# Patient Record
Sex: Female | Born: 1985 | Race: Black or African American | Hispanic: No | Marital: Married | Smoking: Former smoker
Health system: Southern US, Community
[De-identification: ages and names within clinical notes are randomized; demographics above are authoritative.]

## PROBLEM LIST (undated history)

## (undated) DIAGNOSIS — F32A Depression, unspecified: Secondary | ICD-10-CM

## (undated) DIAGNOSIS — R87629 Unspecified abnormal cytological findings in specimens from vagina: Secondary | ICD-10-CM

## (undated) DIAGNOSIS — F419 Anxiety disorder, unspecified: Secondary | ICD-10-CM

## (undated) HISTORY — DX: Unspecified abnormal cytological findings in specimens from vagina: R87.629

---

## 2019-11-12 ENCOUNTER — Other Ambulatory Visit: Payer: Self-pay

## 2019-11-12 ENCOUNTER — Encounter: Payer: Self-pay | Admitting: Obstetrics & Gynecology

## 2019-11-12 ENCOUNTER — Ambulatory Visit (INDEPENDENT_AMBULATORY_CARE_PROVIDER_SITE_OTHER): Payer: Commercial Managed Care - PPO | Admitting: Obstetrics & Gynecology

## 2019-11-12 VITALS — BP 121/73 | HR 88 | Wt 257.0 lb

## 2019-11-12 DIAGNOSIS — Z0000 Encounter for general adult medical examination without abnormal findings: Secondary | ICD-10-CM

## 2019-11-12 NOTE — Progress Notes (Signed)
   GYNECOLOGY OFFICE VISIT NOTE  History:   Shannon Carlson is a 33 y.o. G1P0010 here today requesting annual exam.  Reports regular menstrual periods, cycle length vary by a couple of days but not concerning. Had a miscarriage, also wants to get pregnant. She denies any abnormal vaginal discharge, bleeding, pelvic pain or other concerns.    Past Medical History:  Diagnosis Date  . Vaginal Pap smear, abnormal     History reviewed. No pertinent surgical history.  The following portions of the patient's history were reviewed and updated as appropriate: allergies, current medications, past family history, past medical history, past social history, past surgical history and problem list.   Health Maintenance:  Normal pap many years ago.  Review of Systems:  Pertinent items noted in HPI and remainder of comprehensive ROS otherwise negative.  Physical Exam:  BP 121/73   Pulse 88   Wt 257 lb (116.6 kg)  CONSTITUTIONAL: Well-developed, well-nourished female in no acute distress.  Rest of physical exam deferred     Assessment and Plan:    1. Visit for preventive health examination Patient declined to have annual exam due to cost at this office She was referred to [facility] for breast exam and pap smear. No other gynecologic concerns. Routine preventative health maintenance measures emphasized. Please refer to After Visit Summary for other counseling recommendations.   Return for any gynecologic concerns.    Total face-to-face time with patient: 10 minutes.  Over 50% of encounter was spent on counseling and coordination of care.   Verita Schneiders, MD, [place] for [employer], [place]

## 2019-11-12 NOTE — Patient Instructions (Addendum)
Return to clinic for any scheduled appointments or for any gynecologic concerns as needed.   Preventive Care 32-32 Years Old, Female Preventive care refers to visits with your health care provider and lifestyle choices that can promote health and wellness. This includes:  A yearly physical exam. This may also be called an annual well check.  Regular dental visits and eye exams.  Immunizations.  Screening for certain conditions.  Healthy lifestyle choices, such as eating a healthy diet, getting regular exercise, not using drugs or products that contain nicotine and tobacco, and limiting alcohol use. What can I expect for my preventive care visit? Physical exam Your health care provider will check your:  Height and weight. This may be used to calculate body mass index (BMI), which tells if you are at a healthy weight.  Heart rate and blood pressure.  Skin for abnormal spots. Counseling Your health care provider may ask you questions about your:  Alcohol, tobacco, and drug use.  Emotional well-being.  Home and relationship well-being.  Sexual activity.  Eating habits.  Work and work Statistician.  Method of birth control.  Menstrual cycle.  Pregnancy history. What immunizations do I need?  Influenza (flu) vaccine  This is recommended every year. Tetanus, diphtheria, and pertussis (Tdap) vaccine  You may need a Td booster every 32 years. Varicella (chickenpox) vaccine  You may need this if you have not been vaccinated.32 Human papillomavirus (HPV) vaccine  If recommended by your health care provider, you may need three doses over 6 months. Measles, mumps, and rubella (MMR) vaccine  You may need at least one dose of MMR. You may also need a second dose. Meningococcal conjugate (MenACWY) vaccine  One dose is recommended if you are age 32-21 years and a first-year college student living in a residence hall, or if you have one of several medical conditions. You may  also need additional booster doses. Pneumococcal conjugate (PCV13) vaccine  You may need this if you have certain conditions and were not previously vaccinated.32 Pneumococcal polysaccharide (PPSV23) vaccine  You may need one or two doses if you smoke cigarettes or if you have certain conditions. Hepatitis A vaccine  You may need this if you have certain conditions or if you travel or work in places where you may be exposed to hepatitis A. Hepatitis B vaccine  You may need this if you have certain conditions or if you travel or work in places where you may be exposed to hepatitis B. Haemophilus influenzae type b (Hib) vaccine  You may need this if you have certain conditions. You may receive vaccines as individual doses or as more than one vaccine together in one shot (combination vaccines). Talk with your health care provider about the risks and benefits of combination vaccines. What tests do I need?  Blood tests  Lipid and cholesterol levels. These may be checked every 5 years starting at age 32.  Hepatitis C test.  Hepatitis B test. Screening  Diabetes screening. This is done by checking your blood sugar (glucose) after you have not eaten for a while (fasting).  Sexually transmitted disease (STD) testing.  BRCA-related cancer screening. This may be done if you have a family history of breast, ovarian, tubal, or peritoneal cancers.  Pelvic exam and Pap test. This may be done every 3 years starting at age 32. Starting at age 32, this may be done every 5 years if you have a Pap test in combination with an HPV test. Talk with your health  care provider about your test results, treatment options, and if necessary, the need for more tests. Follow these instructions at home: Eating and drinking   Eat a diet that includes fresh fruits and vegetables, whole grains, lean protein, and low-fat dairy.  Take vitamin and mineral supplements as recommended by your health care  provider.  Do not drink alcohol if: ? Your health care provider tells you not to drink. ? You are pregnant, may be pregnant, or are planning to become pregnant.  If you drink alcohol: ? Limit how much you have to 0-1 drink a day. ? Be aware of how much alcohol is in your drink. In the [place] one drink equals one 12 oz bottle of beer (355 mL), one 5 oz glass of wine (148 mL), or one 1 oz glass of hard liquor (44 mL). Lifestyle  Take daily care of your teeth and gums.  Stay active. Exercise for at least 30 minutes on 5 or more days each week.  Do not use any products that contain nicotine or tobacco, such as cigarettes, e-cigarettes, and chewing tobacco. If you need help quitting, ask your health care provider.  If you are sexually active, practice safe sex. Use a condom or other form of birth control (contraception) in order to prevent pregnancy and STIs (sexually transmitted infections). If you plan to become pregnant, see your health care provider for a preconception visit. What's next?  Visit your health care provider once a year for a well check visit.  Ask your health care provider how often you should have your eyes and teeth checked.  Stay up to date on all vaccines. This information is not intended to replace advice given to you by your health care provider. Make sure you discuss any questions you have with your health care provider. Document Released: 01/22/2002 Document Revised: 08/07/2018 Document Reviewed: 08/07/2018 Elsevier Patient Education  2020 [employer].

## 2019-12-23 ENCOUNTER — Ambulatory Visit: Payer: Commercial Managed Care - PPO | Admitting: Obstetrics and Gynecology

## 2020-01-12 ENCOUNTER — Ambulatory Visit (INDEPENDENT_AMBULATORY_CARE_PROVIDER_SITE_OTHER): Payer: Commercial Managed Care - PPO

## 2020-01-12 ENCOUNTER — Other Ambulatory Visit: Payer: Self-pay

## 2020-01-12 VITALS — BP 128/69 | HR 92 | Wt 262.1 lb

## 2020-01-12 DIAGNOSIS — Z23 Encounter for immunization: Secondary | ICD-10-CM | POA: Diagnosis not present

## 2020-01-12 DIAGNOSIS — Z124 Encounter for screening for malignant neoplasm of cervix: Secondary | ICD-10-CM

## 2020-01-12 DIAGNOSIS — Z1151 Encounter for screening for human papillomavirus (HPV): Secondary | ICD-10-CM

## 2020-01-12 DIAGNOSIS — Z01419 Encounter for gynecological examination (general) (routine) without abnormal findings: Secondary | ICD-10-CM

## 2020-01-12 DIAGNOSIS — F17200 Nicotine dependence, unspecified, uncomplicated: Secondary | ICD-10-CM | POA: Diagnosis not present

## 2020-01-12 NOTE — Patient Instructions (Addendum)
Primary care accepting new patients:   [facility] at [facility] [address] [place],  [place]  [address] [phone]  [facility] at [facility] [place], [place] [address] [place],  [place]  [address] [phone]  Coping with Quitting Smoking  Quitting smoking is a physical and mental challenge. You will face cravings, withdrawal symptoms, and temptation. Before quitting, work with your health care provider to make a plan that can help you cope. Preparation can help you quit and keep you from giving in. How can I cope with cravings? Cravings usually last for 5-10 minutes. If you get through it, the craving will pass. Consider taking the following actions to help you cope with cravings:  Keep your mouth busy: ? Chew sugar-free gum. ? Suck on hard candies or a straw. ? Brush your teeth.  Keep your hands and body busy: ? Immediately change to a different activity when you feel a craving. ? Squeeze or play with a ball. ? Do an activity or a hobby, like making bead jewelry, practicing needlepoint, or working with wood. ? Mix up your normal routine. ? Take a short exercise break. Go for a quick walk or run up and down stairs. ? Spend time in public places where smoking is not allowed.  Focus on doing something kind or helpful for someone else.  Call a friend or family member to talk during a craving.  Join a support group.  Call a quit line, such as 1-800-QUIT-NOW.  Talk with your health care provider about medicines that might help you cope with cravings and make quitting easier for you. How can I deal with withdrawal symptoms? Your body may experience negative effects as it tries to get used to not having nicotine in the system. These effects are called withdrawal symptoms. They may include:  Feeling hungrier than normal.  Trouble  concentrating.  Irritability.  Trouble sleeping.  Feeling depressed.  Restlessness and agitation.  Craving a cigarette. To manage withdrawal symptoms:  Avoid places, people, and activities that trigger your cravings.  Remember why you want to quit.  Get plenty of sleep.  Avoid coffee and other caffeinated drinks. These may worsen some of your symptoms. How can I handle social situations? Social situations can be difficult when you are quitting smoking, especially in the first few weeks. To manage this, you can:  Avoid parties, bars, and other social situations where people might be smoking.  Avoid alcohol.  Leave right away if you have the urge to smoke.  Explain to your family and friends that you are quitting smoking. Ask for understanding and support.  Plan activities with friends or family where smoking is not an option. What are some ways I can cope with stress? Wanting to smoke may cause stress, and stress can make you want to smoke. Find ways to manage your stress. Relaxation techniques can help. For example:  Breathe slowly and deeply, in through your nose and out through your mouth.  Listen to soothing, relaxing music.  Talk with a family member or friend about your stress.  Light a candle.  Soak in a bath or take a shower.  Think about a peaceful place. What are some ways I can prevent weight gain? Be aware that many people gain weight after they quit smoking. However, not everyone does. To keep from gaining weight, have a plan in place before you quit  and stick to the plan after you quit. Your plan should include:  Having healthy snacks. When you have a craving, it may help to: ? Eat plain popcorn, crunchy carrots, celery, or other cut vegetables. ? Chew sugar-free gum.  Changing how you eat: ? Eat small portion sizes at meals. ? Eat 4-6 small meals throughout the day instead of 1-2 large meals a day. ? Be mindful when you eat. Do not watch  television or do other things that might distract you as you eat.  Exercising regularly: ? Make time to exercise each day. If you do not have time for a long workout, do short bouts of exercise for 5-10 minutes several times a day. ? Do some form of strengthening exercise, like weight lifting, and some form of aerobic exercise, like running or swimming.  Drinking plenty of water or other low-calorie or no-calorie drinks. Drink 6-8 glasses of water daily, or as much as instructed by your health care provider. Summary  Quitting smoking is a physical and mental challenge. You will face cravings, withdrawal symptoms, and temptation to smoke again. Preparation can help you as you go through these challenges.  You can cope with cravings by keeping your mouth busy (such as by chewing gum), keeping your body and hands busy, and making calls to family, friends, or a helpline for people who want to quit smoking.  You can cope with withdrawal symptoms by avoiding places where people smoke, avoiding drinks with caffeine, and getting plenty of rest.  Ask your health care provider about the different ways to prevent weight gain, avoid stress, and handle social situations. This information is not intended to replace advice given to you by your health care provider. Make sure you discuss any questions you have with your health care provider. Document Revised: 11/08/2017 Document Reviewed: 11/23/2016 Elsevier Patient Education  2020 [employer].

## 2020-01-12 NOTE — Progress Notes (Signed)
GYNECOLOGY OFFICE VISIT NOTE-WELL WOMAN EXAM  History:   Shannon Carlson G1P0010 here today for annual exam.  She is currently not taking any birth control method and desires pregnancy.  She endorses usage of prenatal vitamins daily. She endorses pregnancy in the past with unknown GA in 2010/11.    She reports monthly menses lasting 3 days with moderate flow.  She reports some cramping that lasts about a day and "a few blood clots...that are small as a pea." She states she takes "BCs the green pack" for cramping. LMP was 12/17/2019 and was normal.   She reports having one partner in the last year. She reports sexual activity without pain or discomfort.  She denies any abnormal vaginal discharge, bleeding, or pelvic pain. She chooses to wait for STD testing due to costs.  Shannon Carlson denies issues with her breast and does not perform breast exams. She endorses breast awareness.  She denies a family history of breast, uterine, cervical, or ovarian cancer.  Shannon Carlson does not have a primary care doctor and does not exercise.  She reports smoking 5 cigarettes a day and desires to quit. However, she denies alcohol or drug use. She endorses balanced nutritional intake and states she gets lots of fruits and veggies.    Shannon Carlson reports that she is currently on furlough, but is employed as a Advertising copywriter at [employer]. She endorses safety at home and has good social support.  She reports some feelings of depression stating that "I have been feeling down after my fiance had a heart stent and did not want sexual contact."   She states that she feels a lot better now after talking with her fiance. She denies DV/A in the past 12 months.     Past Medical History:  Diagnosis Date  . Vaginal Pap smear, abnormal     No past surgical history on file.  The following portions of the patient's history were reviewed and updated as appropriate: allergies, current medications, past family history, past  medical history, past social history, past surgical history and problem list.   Health Maintenance:  Patient reports that her last pap was abnormal with colposcopy and biospy.  No mammogram d/t history.   Review of Systems:  Pertinent items noted in HPI and remainder of comprehensive ROS otherwise negative.    Objective:    Physical Exam BP 128/69   Pulse 92   Wt 262 lb 1.6 oz (118.9 kg)   LMP 12/17/2019 (Exact Date)  Physical Exam Exam conducted with a chaperone present.  Constitutional:      Appearance: Normal appearance.  HENT:     Head: Normocephalic and atraumatic.  Eyes:     Conjunctiva/sclera: Conjunctivae normal.  Cardiovascular:     Rate and Rhythm: Regular rhythm.     Heart sounds: Normal heart sounds.  Pulmonary:     Effort: Pulmonary effort is normal.     Breath sounds: Normal breath sounds.  Abdominal:     General: Bowel sounds are normal.  Genitourinary:    Labia:        Right: No tenderness or lesion.        Left: No tenderness or lesion.      Vagina: No vaginal discharge or bleeding.     Cervix: Cervical bleeding (After pap collected) present. No cervical motion tenderness, discharge, friability, lesion or erythema.     Comments: Pap collected with brush and spatula Uterine size and position difficult to assess d/t body  habitus, but no apparent enlargement.  Musculoskeletal:        General: Normal range of motion.     Cervical back: Normal range of motion.  Skin:    General: Skin is warm and dry.  Neurological:     Mental Status: She is alert.  Psychiatric:        Mood and Affect: Mood normal.        Behavior: Behavior normal.        Thought Content: Thought content normal.      Labs and Imaging No results found for this or any previous visit (from the past 168 hour(s)). No results found.   Assessment & Plan:       1. Well woman exam with routine gynecological exam -Exam findings discussed. -Educated on ASCCP guidelines regarding pap smear  evaluation and frequency. -Informed of turnover time and provider/clinic policy on releasing results. -Encouraged to activate Mychart and informed that all results will be released for patient review as they arrive. -Educated on AHA exercise recommendations of 30 minutes of moderate to vigorous activity at least 5x/week. -Educated and encouraged to initiate monthly SBE with increased breast awareness including examination of breast for skin changes, moles, tenderness, etc.  -Encouraged to continue PNV while trying to conceive. -Instructed to contact office if no conception after one year of actively trying.   - Cytology - PAP( [facility]) - Tdap vaccine greater than or equal to 7yo IM   2. Current every day smoker -Information given for Quitline. -Discussed how smoking cessation would be strongly advised prior to conception.    Routine preventative health maintenance measures emphasized. Please refer to After Visit Summary for other counseling recommendations.   No follow-ups on file.      Maryann Conners, CNM 01/12/2020

## 2020-01-14 LAB — CYTOLOGY - PAP
Comment: NEGATIVE
Diagnosis: NEGATIVE
High risk HPV: NEGATIVE

## 2020-01-15 ENCOUNTER — Telehealth: Payer: Self-pay

## 2020-01-15 DIAGNOSIS — A599 Trichomoniasis, unspecified: Secondary | ICD-10-CM

## 2020-01-15 MED ORDER — METRONIDAZOLE 500 MG PO TABS: 2000.0000 mg | ORAL_TABLET | Freq: Once | ORAL | 0 refills | Status: AC

## 2020-01-15 NOTE — Telephone Encounter (Signed)
Shannon Carlson  [birth year] [id number]  Patient called and verified her identity via birth date and last 4 of her SSN.  Patient agreeable to results via phone and was informed of positive trichomoniasis findings on her pap smear.  Patient informed that this is a sexually transmitted infection.  Patient appropriately upset and comfort given.  Patient informed that script would be sent to pharmacy on file and recommendation for partner treatment made. Patient confirms pharmacy and instructed to take medication at least one hour after eating.  Patient questions addressed and provider will give information regarding trichomoniasis via mychart for patient review.  Patient without further questions or concerns and encouraged to call office if any should arise.  Cherre Robins MSN, CNM Advanced Practice Provider, Center for [facility]   **This visit was completed, in its entirety, via telehealth communications.  I personally spent >/=4 minutes on the phone providing recommendations, education, and guidance.**

## 2020-03-29 NOTE — BH Specialist Note (Signed)
Integrated Behavioral Health  Visit  03/29/2020 Shannon Carlson [id number]  Number of Integrated Behavioral Health visits: 1 Session Start time: 1:17  Session End time: 2:15 Total time: 61  Referring Provider: Jaynie Collins, MD Type of Visit: In person Patient/Family location: [facility] Provider location: [facility] All persons participating in visit: Patient Shannon Carlson and [facility] Emmelina Mcloughlin    Confirmed patient's address: Yes  Confirmed patient's phone number: Yes  Any changes to demographics: No   Confirmed patient's insurance: Yes  Any changes to patient's insurance: No   Discussed confidentiality: Yes   I connected with Temika Sutphin  In person verified that I am speaking with the correct person using two identifiers.    Patient and/or legal guardian expressed understanding and consented to in person visit: Yes   PRESENTING CONCERNS: Patient and/or family reports the following symptoms/concerns: Pt states her primary concern today is noticing that she is having mood swings, crying, irritability, depression, anxiety, poor concentration, fatigue, sleep difficulty, in the week preceding menses every month for at least the past 6 menstrual cycles; family has noticed as well. Pt also experiencing life stress that may be contributing factors; is open to both starting medication and learning a coping strategy today. Duration of problem: At least 6 months (symptoms may have begun earlier); Severity of problem: moderate  STRENGTHS (Protective Factors/Coping Skills): Strong social support, self-awareness; open to treatment  GOALS ADDRESSED: Patient will: 1.  Reduce symptoms of: anxiety, depression and stress  2.  Increase knowledge and/or ability of: self-management skills  3.  Demonstrate ability to: Increase healthy adjustment to current life circumstances and Increase motivation to adhere to plan of care  INTERVENTIONS: Interventions utilized:  Mindfulness or  Management consultant and Psychoeducation and/or Health Education Standardized Assessments completed: Not needed today  ASSESSMENT: Patient currently experiencing Premenstrual dysphoric disorder  Patient may benefit from psychoeducation and brief therapeutic interventions regarding coping with symptoms of anxiety and depression related to PMDD and life stress .  PLAN: 1. Follow up with behavioral health clinician on : Two weeks 2. Behavioral recommendations:  -Begin taking Zoloft as prescribed -CALM relaxation breathing exercise twice daily (morning; evening) 3. Referral(s): Integrated [employer] (In Clinic)  I discussed the assessment and treatment plan with the patient and/or parent/guardian. They were provided an opportunity to ask questions and all were answered. They agreed with the plan and demonstrated an understanding of the instructions.   They were advised to call back or seek an in-person evaluation if the symptoms worsen or if the condition fails to improve as anticipated.  Valetta Close Anahis Furgeson

## 2020-03-30 ENCOUNTER — Other Ambulatory Visit: Payer: Self-pay | Admitting: Obstetrics and Gynecology

## 2020-03-30 ENCOUNTER — Ambulatory Visit (INDEPENDENT_AMBULATORY_CARE_PROVIDER_SITE_OTHER): Payer: Commercial Managed Care - PPO | Admitting: Clinical

## 2020-03-30 DIAGNOSIS — F3281 Premenstrual dysphoric disorder: Secondary | ICD-10-CM | POA: Diagnosis not present

## 2020-03-30 DIAGNOSIS — Z658 Other specified problems related to psychosocial circumstances: Secondary | ICD-10-CM

## 2020-03-30 MED ORDER — SERTRALINE HCL 50 MG PO TABS: 50.0000 mg | ORAL_TABLET | Freq: Every day | ORAL | 4 refills | Status: DC

## 2020-03-30 NOTE — Patient Instructions (Signed)

## 2020-04-13 ENCOUNTER — Ambulatory Visit (INDEPENDENT_AMBULATORY_CARE_PROVIDER_SITE_OTHER): Payer: Commercial Managed Care - PPO | Admitting: Clinical

## 2020-04-13 ENCOUNTER — Other Ambulatory Visit: Payer: Self-pay

## 2020-04-13 DIAGNOSIS — Z658 Other specified problems related to psychosocial circumstances: Secondary | ICD-10-CM

## 2020-04-13 DIAGNOSIS — F3281 Premenstrual dysphoric disorder: Secondary | ICD-10-CM

## 2020-04-13 NOTE — BH Specialist Note (Signed)
Integrated Behavioral Health via Telemedicine Video Visit  04/13/2020 Makaela Cando [id number]  Number of Integrated Behavioral Health visits: 2 Session Start time: 1:46  Session End time: 1:59 Total time: 13  Referring Provider: Jaynie Collins, MD Type of Visit: Video Patient/Family location: Home [facility] Provider location: Center for [facility]  All persons participating in visit: Patient Petronella Shuford and [facility] Amaira Safley    Confirmed patient's address: Yes  Confirmed patient's phone number: Yes  Any changes to demographics: No   Confirmed patient's insurance: Yes  Any changes to patient's insurance: No   Discussed confidentiality: at previous visit  I connected with Cyndia Bent  by a video enabled telemedicine application and verified that I am speaking with the correct person using two identifiers.     I discussed the limitations of evaluation and management by telemedicine and the availability of in person appointments.  I discussed that the purpose of this visit is to provide behavioral health care while limiting exposure to the novel coronavirus.   Discussed there is a possibility of technology failure and discussed alternative modes of communication if that failure occurs.  I discussed that engaging in this video visit, they consent to the provision of behavioral healthcare and the services will be billed under their insurance.  Patient and/or legal guardian expressed understanding and consented to video visit: Yes   PRESENTING CONCERNS: Patient and/or family reports the following symptoms/concerns: Pt states she is taking Zoloft as prescribed, felt a little tired after starting to take it, and no other side effects noticed. Pt says her family has noticed an improvement in her mood on a daily basis, shortly after beginning both Zoloft and daily self-coping strategies were implemented.  Duration of problem: At least 6 months,  possibly longer; Severity of problem: moderate  STRENGTHS (Protective Factors/Coping Skills): Strong social support, self-awareness and adhering to treatment  GOALS ADDRESSED: Patient will: 1.  Reduce symptoms of: anxiety, depression and stress   2.  Demonstrate ability to: Increase motivation to adhere to plan of care  INTERVENTIONS: Interventions utilized:  Supportive Counseling and Medication Monitoring Standardized Assessments completed: Will do PHQ9/GAD7 at next visit; will check symptoms after completion of menstrual cycle  ASSESSMENT: Patient currently experiencing Premenstrual dysphoric disorder.   Patient may benefit from continued psychoeducation and brief therapeutic interventions regarding coping with symptoms of anxiety, depression, and stress .  PLAN: 1. Follow up with behavioral health clinician on : Two weeks 2. Behavioral recommendations:  -Continue taking Zoloft as prescribed -Continue CALM relaxation breathing exercise twice daily  -Consider using apps as additional self-care within the next two weeks, if needed -Continue planning summer vacation (future thinking!) 3. Referral(s): Integrated [employer] [facility] (In Clinic)  I discussed the assessment and treatment plan with the patient and/or parent/guardian. They were provided an opportunity to ask questions and all were answered. They agreed with the plan and demonstrated an understanding of the instructions.   They were advised to call back or seek an in-person evaluation if the symptoms worsen or if the condition fails to improve as anticipated.  Valetta Close Tanija Germani

## 2020-04-21 NOTE — BH Specialist Note (Signed)
Integrated Behavioral Health via Telemedicine Video Visit  04/21/2020 Shannon Carlson [id number]  Number of [place] visits: 3 Session Start time: 2:53  Session End time: 2:57 Total time: 4  Referring Provider: Verita Schneiders, MD Type of Visit: Video Patient/Family location: Home [facility] Provider location: Center for [place] [facility]  All persons participating in visit: Patient Shannon Carlson and [place]    Confirmed patient's address: Yes  Confirmed patient's phone number: Yes  Any changes to demographics: No   Confirmed patient's insurance: Yes  Any changes to patient's insurance: No   Discussed confidentiality: at previous visit  I connected with Shannon Carlson  by a video enabled telemedicine application and verified that I am speaking with the correct person using two identifiers.     I discussed the limitations of evaluation and management by telemedicine and the availability of in person appointments.  I discussed that the purpose of this visit is to provide behavioral health care while limiting exposure to the novel coronavirus.   Discussed there is a possibility of technology failure and discussed alternative modes of communication if that failure occurs.  I discussed that engaging in this video visit, they consent to the provision of behavioral healthcare and the services will be billed under their insurance.  Patient and/or legal guardian expressed understanding and consented to video visit: Yes   PRESENTING CONCERNS: Patient and/or family reports the following symptoms/concerns: Pt states she has maintained mood stability on Zoloft through past menstrual cycle; agrees to call Shannon Carlson if symptoms increase again, prior to next cycle.  Duration of problem: Over 6 months; Severity of problem: mild  STRENGTHS (Protective Factors/Coping Skills): Strong social support, self-aware; adhering to treatment    GOALS ADDRESSED: Patient will: 1.  Reduce symptoms of: anxiety, depression and stress  2.  Demonstrate ability to: Increase healthy adjustment to current life circumstances  INTERVENTIONS: Interventions utilized:  Supportive Counseling Standardized Assessments completed: Not Needed  ASSESSMENT: Patient currently experiencing Premenstrual dysphoric disorder .   Patient may benefit from continued psychoeducation and brief therapeutic interventions regarding coping with symptoms of mood instability with PMDD .  PLAN: 1. Follow up with behavioral health clinician on : As needed 2. Behavioral recommendations:  -Continue taking Zoloft as prescribed -Continue using daily self-coping strategies that have been helpful in reducing mood instability (relaxation breathing, apps, making vacation plans, time outdoors, etc.) 3. Referral(s): [place] (In Clinic)  I discussed the assessment and treatment plan with the patient and/or parent/guardian. They were provided an opportunity to ask questions and all were answered. They agreed with the plan and demonstrated an understanding of the instructions.   They were advised to call back or seek an in-person evaluation if the symptoms worsen or if the condition fails to improve as anticipated.  Shannon Carlson Shannon Carlson  Depression screen [facility] 2/9 01/12/2020 11/12/2019  Decreased Interest 0 1  Down, Depressed, Hopeless 0 1  PHQ - 2 Score 0 2  Altered sleeping 0 0  Tired, decreased energy 0 1  Change in appetite 0 0  Feeling bad or failure about yourself  1 0  Trouble concentrating 0 0  Moving slowly or fidgety/restless 0 0  Suicidal thoughts 0 0  PHQ-9 Score 1 3   GAD 7 : Generalized Anxiety Score 01/12/2020 11/12/2019  Nervous, Anxious, on Edge 0 0  Control/stop worrying 1 1  Worry too much - different things 1 1  Trouble relaxing 1 1  Restless  0 0  Easily annoyed or irritable 1 1  Afraid - awful might happen 1 0  Total  GAD 7 Score 5 4

## 2020-04-27 ENCOUNTER — Other Ambulatory Visit: Payer: Self-pay

## 2020-04-27 ENCOUNTER — Ambulatory Visit (INDEPENDENT_AMBULATORY_CARE_PROVIDER_SITE_OTHER): Payer: Commercial Managed Care - PPO | Admitting: Clinical

## 2020-04-27 DIAGNOSIS — F3281 Premenstrual dysphoric disorder: Secondary | ICD-10-CM

## 2020-05-21 ENCOUNTER — Other Ambulatory Visit: Payer: Self-pay | Admitting: Obstetrics and Gynecology

## 2020-09-14 ENCOUNTER — Telehealth: Payer: Self-pay

## 2020-09-14 MED ORDER — SERTRALINE HCL 50 MG PO TABS: 50.0000 mg | ORAL_TABLET | Freq: Every day | ORAL | 4 refills | Status: DC

## 2020-09-14 NOTE — Telephone Encounter (Signed)
Received message from on call nurse concerning patient. Patient reported she is in need of medication. I attempted to call back but there was no answer.

## 2020-09-14 NOTE — Addendum Note (Signed)
Addended by: Catalina Antigua on: 09/14/2020 04:33 PM   Modules accepted: Orders

## 2020-09-14 NOTE — Telephone Encounter (Signed)
Patient is requesting a refill on zoloft. Are you ok with this? I am going to help her find PCP.

## 2021-04-14 ENCOUNTER — Encounter (HOSPITAL_COMMUNITY): Payer: Self-pay

## 2021-04-14 ENCOUNTER — Ambulatory Visit (HOSPITAL_COMMUNITY)
Admission: EM | Admit: 2021-04-14 | Discharge: 2021-04-14 | Disposition: A | Discharge status: Home / Self Care | Payer: Commercial Managed Care - PPO | Attending: Emergency Medicine | Admitting: Emergency Medicine

## 2021-04-14 ENCOUNTER — Other Ambulatory Visit: Payer: Self-pay

## 2021-04-14 ENCOUNTER — Ambulatory Visit (INDEPENDENT_AMBULATORY_CARE_PROVIDER_SITE_OTHER): Payer: Commercial Managed Care - PPO

## 2021-04-14 DIAGNOSIS — M25571 Pain in right ankle and joints of right foot: Secondary | ICD-10-CM | POA: Diagnosis not present

## 2021-04-14 DIAGNOSIS — W19XXXA Unspecified fall, initial encounter: Secondary | ICD-10-CM | POA: Diagnosis not present

## 2021-04-14 DIAGNOSIS — S93411A Sprain of calcaneofibular ligament of right ankle, initial encounter: Secondary | ICD-10-CM | POA: Diagnosis not present

## 2021-04-14 MED ORDER — IBUPROFEN 800 MG PO TABS: 800.0000 mg | ORAL_TABLET | Freq: Three times a day (TID) | ORAL | 0 refills | Status: DC

## 2021-04-14 NOTE — ED Triage Notes (Signed)
Pt in with c/o right ankle pain that occurred today when she fell over her dog  Denies numbness or tingling to extremity

## 2021-04-14 NOTE — ED Provider Notes (Signed)
[facility]    CSN: [id number] Arrival date & time: 04/14/21  7353      History   Chief Complaint Chief Complaint  Patient presents with  . Ankle Pain    HPI Shannon Carlson is a 35 y.o. female.   Shannon Carlson presents with complaints of right ankle pain after an injury yesterday. Her dog got under her feet causing her to trip and fall. She is uncertain if it twisted or if she struck it on the wall/chair/floor. Pain with flexion and dorsiflexion to posterior heel of foot. Has had mild sprains in the past but no previous foot or ankle surgeries or fractures. No redness or warmth. No numbness or tingling. Pain with weight bearing.     ROS per HPI, negative if not otherwise mentioned.      Past Medical History:  Diagnosis Date  . Vaginal Pap smear, abnormal     There are no problems to display for this patient.   History reviewed. No pertinent surgical history.  OB History    Gravida  1   Para      Term      Preterm      AB  1   Living        SAB  1   IAB      Ectopic      Multiple      Live Births               Home Medications    Prior to Admission medications   Medication Sig Start Date End Date Taking? Authorizing Provider  ibuprofen (ADVIL) 800 MG tablet Take 1 tablet (800 mg total) by mouth 3 (three) times daily. 04/14/21  Yes Georgetta Haber, NP  Prenatal Vit-Fe Fumarate-FA (PRENATAL PO) Take by mouth.    [provider]  sertraline (ZOLOFT) 50 MG tablet Take 1 tablet (50 mg total) by mouth daily. 09/14/20   Constant, Peggy, MD    Family History Family History  Problem Relation Age of Onset  . Asthma Mother   . Hypertension Father   . Hypertension Paternal Uncle   . Hypertension Paternal Grandmother   . Cancer Paternal Grandmother   . Hypertension Paternal Grandfather     Social History Social History   Tobacco Use  . Smoking status: Current Every Day Smoker    Packs/day: 1.00     Years: 14.00    Pack years: 14.00    Types: Cigarettes  . Smokeless tobacco: Never Used  Vaping Use  . Vaping Use: Never used  Substance Use Topics  . Alcohol use: Never  . Drug use: Yes    Types: Marijuana     Allergies   Penicillins   Review of Systems Review of Systems   Physical Exam Triage Vital Signs ED Triage Vitals  Enc Vitals Group     BP 04/14/21 1025 112/75     Pulse Rate 04/14/21 1025 82     Resp 04/14/21 1025 19     Temp 04/14/21 1025 98.4 F (36.9 C)     Temp src --      SpO2 04/14/21 1025 98 %     Weight --      Height --      Head Circumference --      Peak Flow --      Pain Score 04/14/21 1023 7     Pain Loc --      Pain Edu? --  Excl. in GC? --    No data found.  Updated Vital Signs BP 112/75   Pulse 82   Temp 98.4 F (36.9 C)   Resp 19   LMP 03/30/2021 (Approximate)   SpO2 98%   Visual Acuity Right Eye Distance:   Left Eye Distance:   Bilateral Distance:    Right Eye Near:   Left Eye Near:    Bilateral Near:     Physical Exam Constitutional:      General: She is not in acute distress.    Appearance: She is well-developed.  Cardiovascular:     Rate and Rhythm: Normal rate.  Pulmonary:     Effort: Pulmonary effort is normal.  Musculoskeletal:     Right ankle:     Right Achilles Tendon: Tenderness present. No defects. Thompson's test negative.     Right foot: Tenderness and bony tenderness present.     Comments: Tenderness about the posterior calcaneous with negative thompsons; no redness swelling bruising or warmth   Skin:    General: Skin is warm and dry.  Neurological:     Mental Status: She is alert and oriented to person, place, and time.      UC Treatments / Results  Labs (all labs ordered are listed, but only abnormal results are displayed) Labs Reviewed - No data to display  EKG   Radiology DG Ankle Complete Right  Result Date: 04/14/2021 CLINICAL DATA:  Trip and fall, pain, unable to bear  weight EXAM: RIGHT ANKLE - COMPLETE 3+ VIEW COMPARISON:  None. FINDINGS: There is no evidence of fracture, dislocation, or joint effusion. There is no evidence of arthropathy or other focal bone abnormality. Mild, diffuse soft tissue edema about the ankle. IMPRESSION: No fracture or dislocation of the right ankle. Mild, diffuse soft tissue edema. Electronically Signed   By: Lauralyn Primes M.D.   On: 04/14/2021 11:14    Procedures Procedures (including critical care time)  Medications Ordered in UC Medications - No data to display  Initial Impression / Assessment and Plan / UC Course  I have reviewed the triage vital signs and the nursing notes.  Pertinent labs & imaging results that were available during my care of the patient were reviewed by me and considered in my medical decision making (see chart for details).     Consistent with sprain; cam walker and crutches provided. Pain management and expected course of rehab discussed. Patient verbalized understanding and agreeable to plan.   Final Clinical Impressions(s) / UC Diagnoses   Final diagnoses:  Sprain of calcaneofibular ligament of right ankle, initial encounter     Discharge Instructions     Ice, elevation, ibuprofen every 8 hours as needed for pain.  Walking boot as needed for activity, wean off and out of this as able.  Follow up with sports medicine or orthopedics as needed if no improvement or worsening in the next 6 weeks.     ED Prescriptions    Medication Sig Dispense Auth. Provider   ibuprofen (ADVIL) 800 MG tablet Take 1 tablet (800 mg total) by mouth 3 (three) times daily. 30 tablet Georgetta Haber, NP     PDMP not reviewed this encounter.   Georgetta Haber, NP 04/14/21 1146

## 2021-04-14 NOTE — Discharge Instructions (Signed)
Ice, elevation, ibuprofen every 8 hours as needed for pain.  Walking boot as needed for activity, wean off and out of this as able.  Follow up with sports medicine or orthopedics as needed if no improvement or worsening in the next 6 weeks.

## 2021-10-18 ENCOUNTER — Ambulatory Visit (INDEPENDENT_AMBULATORY_CARE_PROVIDER_SITE_OTHER): Payer: Commercial Managed Care - PPO

## 2021-10-18 ENCOUNTER — Encounter (HOSPITAL_COMMUNITY): Payer: Self-pay | Admitting: Emergency Medicine

## 2021-10-18 ENCOUNTER — Other Ambulatory Visit: Payer: Self-pay

## 2021-10-18 ENCOUNTER — Ambulatory Visit (HOSPITAL_COMMUNITY)
Admission: EM | Admit: 2021-10-18 | Discharge: 2021-10-18 | Disposition: A | Discharge status: Home / Self Care | Payer: Commercial Managed Care - PPO | Attending: Medical Oncology | Admitting: Medical Oncology

## 2021-10-18 DIAGNOSIS — R062 Wheezing: Secondary | ICD-10-CM | POA: Diagnosis not present

## 2021-10-18 DIAGNOSIS — R051 Acute cough: Secondary | ICD-10-CM | POA: Diagnosis not present

## 2021-10-18 DIAGNOSIS — R059 Cough, unspecified: Secondary | ICD-10-CM

## 2021-10-18 DIAGNOSIS — R0989 Other specified symptoms and signs involving the circulatory and respiratory systems: Secondary | ICD-10-CM

## 2021-10-18 DIAGNOSIS — R0602 Shortness of breath: Secondary | ICD-10-CM | POA: Diagnosis not present

## 2021-10-18 MED ORDER — FLUTICASONE PROPIONATE 50 MCG/ACT NA SUSP: 2.0000 | Freq: Every day | NASAL | 0 refills | Status: DC

## 2021-10-18 MED ORDER — PREDNISONE 10 MG (21) PO TBPK: ORAL_TABLET | Freq: Every day | ORAL | 0 refills | Status: DC

## 2021-10-18 MED ORDER — ALBUTEROL SULFATE HFA 108 (90 BASE) MCG/ACT IN AERS: 1.0000 | INHALATION_SPRAY | Freq: Four times a day (QID) | RESPIRATORY_TRACT | 0 refills | Status: DC | PRN

## 2021-10-18 MED ORDER — BENZONATATE 100 MG PO CAPS: 100.0000 mg | ORAL_CAPSULE | Freq: Three times a day (TID) | ORAL | 0 refills | Status: DC

## 2021-10-18 NOTE — ED Triage Notes (Signed)
Nasal congestion, cough, headache. Tried robitussin, mucinex. Reports that cough is dry. Had home test 2 days ago that was negative. Work sent home and has to get a note cleared to return.

## 2021-10-18 NOTE — ED Provider Notes (Signed)
[facility]    CSN: [id number] Arrival date & time: 10/18/21  1533      History   Chief Complaint Chief Complaint  Patient presents with   Cough   Nasal Congestion    HPI Shannon Carlson is a 35 y.o. female.   HPI  Cough: Pt reports that she has had a dry cough for the past 7-10 days. Cough is dry in nature. Having coughing fits with wheezing which is something that she has never had. She reports no fever but does have some mild SOB and heaviness of chest when coughing. She has tried mucinex and robotussin but just feels that there is mucous in her lungs that she cannot bring up. No known sick contacts. No vomiting, diarrhea, hemoptysis.   Past Medical History:  Diagnosis Date   Vaginal Pap smear, abnormal     There are no problems to display for this patient.   History reviewed. No pertinent surgical history.  OB History     Gravida  1   Para      Term      Preterm      AB  1   Living         SAB  1   IAB      Ectopic      Multiple      Live Births               Home Medications    Prior to Admission medications   Medication Sig Start Date End Date Taking? Authorizing Provider  albuterol (VENTOLIN HFA) 108 (90 Base) MCG/ACT inhaler Inhale 1-2 puffs into the lungs every 6 (six) hours as needed for wheezing or shortness of breath. 10/18/21  Yes Krisalyn Yankowski M, PA-C  benzonatate (TESSALON) 100 MG capsule Take 1 capsule (100 mg total) by mouth every 8 (eight) hours. 10/18/21  Yes Bijan Ridgley M, PA-C  fluticasone [facility]) 50 MCG/ACT nasal spray Place 2 sprays into both nostrils daily. 10/18/21  Yes Holden Draughon M, PA-C  predniSONE (STERAPRED UNI-PAK 21 TAB) 10 MG (21) TBPK tablet Take by mouth daily. Start on 10/19/2021. Take 6 tabs by mouth daily  for 2 days, then 5 tabs for 2 days, then 4 tabs for 2 days, then 3 tabs for 2 days, 2 tabs for 2 days, then 1 tab by mouth daily for 2 days 10/18/21  Yes Jani Ploeger  M, PA-C  ibuprofen (ADVIL) 800 MG tablet Take 1 tablet (800 mg total) by mouth 3 (three) times daily. 04/14/21   Georgetta Haber, NP  Prenatal Vit-Fe Fumarate-FA (PRENATAL PO) Take by mouth.    [provider]  sertraline (ZOLOFT) 50 MG tablet Take 1 tablet (50 mg total) by mouth daily. 09/14/20   Constant, Peggy, MD    Family History Family History  Problem Relation Age of Onset   Asthma Mother    Hypertension Father    Hypertension Paternal Uncle    Hypertension Paternal Grandmother    Cancer Paternal Grandmother    Hypertension Paternal Grandfather     Social History Social History   Tobacco Use   Smoking status: Every Day    Packs/day: 1.00    Years: 14.00    Pack years: 14.00    Types: Cigarettes   Smokeless tobacco: Never  Vaping Use   Vaping Use: Never used  Substance Use Topics   Alcohol use: Never   Drug use: Yes    Types: Marijuana  Allergies   Penicillins   Review of Systems Review of Systems  As stated above in HPI Physical Exam Triage Vital Signs ED Triage Vitals  Enc Vitals Group     BP 10/18/21 1644 127/73     Pulse Rate 10/18/21 1644 86     Resp 10/18/21 1644 18     Temp 10/18/21 1644 98.8 F (37.1 C)     Temp Source 10/18/21 1644 Oral     SpO2 10/18/21 1644 97 %     Weight --      Height --      Head Circumference --      Peak Flow --      Pain Score 10/18/21 1643 6     Pain Loc --      Pain Edu? --      Excl. in GC? --    No data found.  Updated Vital Signs BP 127/73 (BP Location: Left Arm)   Pulse 86   Temp 98.8 F (37.1 C) (Oral)   Resp 18   LMP 10/10/2021   SpO2 97%   Physical Exam Vitals and nursing note reviewed.  Constitutional:      General: She is not in acute distress.    Appearance: Normal appearance. She is not ill-appearing, toxic-appearing or diaphoretic.  HENT:     Head: Normocephalic and atraumatic.     Right Ear: Tympanic membrane normal.     Left Ear: Tympanic membrane normal.     Nose:  Congestion present. No rhinorrhea.     Mouth/Throat:     Mouth: Mucous membranes are moist.     Pharynx: Oropharynx is clear. No oropharyngeal exudate or posterior oropharyngeal erythema.  Eyes:     Extraocular Movements: Extraocular movements intact.     Pupils: Pupils are equal, round, and reactive to light.  Cardiovascular:     Rate and Rhythm: Normal rate and regular rhythm.     Heart sounds: Normal heart sounds.  Pulmonary:     Effort: Pulmonary effort is normal.     Breath sounds: Wheezing and rhonchi (Left lower and right middle and right lung fields) present.  Musculoskeletal:     Cervical back: Normal range of motion and neck supple.  Lymphadenopathy:     Cervical: No cervical adenopathy.  Skin:    General: Skin is warm.  Neurological:     Mental Status: She is alert and oriented to person, place, and time.     UC Treatments / Results  Labs (all labs ordered are listed, but only abnormal results are displayed) Labs Reviewed - No data to display  EKG   Radiology DG Chest 2 View  Result Date: 10/18/2021 CLINICAL DATA:  Cough for 10 days with wheezing and rhonchi. EXAM: CHEST - 2 VIEW COMPARISON:  None. FINDINGS: The heart size and mediastinal contours are normal. The lungs are clear. There is no pleural effusion or pneumothorax. No acute osseous findings are identified. IMPRESSION: No active cardiopulmonary process. Electronically Signed   By: Carey Bullocks M.D.   On: 10/18/2021 17:32    Procedures Procedures (including critical care time)  Medications Ordered in UC Medications - No data to display  Initial Impression / Assessment and Plan / UC Course  I have reviewed the triage vital signs and the nursing notes.  Pertinent labs & imaging results that were available during my care of the patient were reviewed by me and considered in my medical decision making (see chart for details).  New. Chest x ray to rule out pneumonia vs bronchitis. Treating with  prednisone, albuterol, tessalon and flonase- will add ABX if pneumonia is seen. Discussed red flag signs and symptoms along with recommendation for rest. Follow up PRN.  Final Clinical Impressions(s) / UC Diagnoses   Final diagnoses:  Acute cough  SOB (shortness of breath)  Rhonchi at both lung bases  Wheeze   Discharge Instructions   None    ED Prescriptions     Medication Sig Dispense Auth. Provider   albuterol (VENTOLIN HFA) 108 (90 Base) MCG/ACT inhaler Inhale 1-2 puffs into the lungs every 6 (six) hours as needed for wheezing or shortness of breath. 1 each Umberto Pavek M, PA-C   fluticasone [facility]) 50 MCG/ACT nasal spray Place 2 sprays into both nostrils daily. 16 mL Nina Hoar M, PA-C   benzonatate (TESSALON) 100 MG capsule Take 1 capsule (100 mg total) by mouth every 8 (eight) hours. 21 capsule Jaritza Duignan M, PA-C   predniSONE (STERAPRED UNI-PAK 21 TAB) 10 MG (21) TBPK tablet Take by mouth daily. Start on 10/19/2021. Take 6 tabs by mouth daily  for 2 days, then 5 tabs for 2 days, then 4 tabs for 2 days, then 3 tabs for 2 days, 2 tabs for 2 days, then 1 tab by mouth daily for 2 days 42 tablet Sherrel Shafer, Brand Males, [place]      PDMP not reviewed this encounter.   Rushie Chestnut, [place] 10/18/21 1742

## 2021-12-15 ENCOUNTER — Other Ambulatory Visit: Payer: Self-pay

## 2021-12-15 ENCOUNTER — Ambulatory Visit (HOSPITAL_COMMUNITY)
Admission: EM | Admit: 2021-12-15 | Discharge: 2021-12-15 | Disposition: A | Discharge status: Home / Self Care | Payer: Commercial Managed Care - PPO | Attending: Family Medicine | Admitting: Family Medicine

## 2021-12-15 ENCOUNTER — Encounter (HOSPITAL_COMMUNITY): Payer: Self-pay | Admitting: Emergency Medicine

## 2021-12-15 ENCOUNTER — Ambulatory Visit (INDEPENDENT_AMBULATORY_CARE_PROVIDER_SITE_OTHER): Payer: Commercial Managed Care - PPO

## 2021-12-15 DIAGNOSIS — M779 Enthesopathy, unspecified: Secondary | ICD-10-CM | POA: Diagnosis not present

## 2021-12-15 DIAGNOSIS — M25531 Pain in right wrist: Secondary | ICD-10-CM

## 2021-12-15 MED ORDER — PREDNISONE 20 MG PO TABS: 40.0000 mg | ORAL_TABLET | Freq: Every day | ORAL | 0 refills | Status: AC

## 2021-12-15 NOTE — Discharge Instructions (Addendum)
Prednisone 20 mg, take 2 daily for 5 days.  Ice and elevate your sore wrist.

## 2021-12-15 NOTE — ED Provider Notes (Addendum)
[facility]    CSN: [id number] Arrival date & time: 12/15/21  1222      History   Chief Complaint Chief Complaint  Patient presents with   Hand Pain    HPI Shannon Carlson is a 36 y.o. female.    Hand Pain  Here for right wrist and hand pain. She noted some sharp pains in right wrist last evening. Today she awoke with swelling and more pain in wrist and her right hand over the metacarpals. Hurts to make a fist and to extend that wrist. No known trauma. She works at a hotel doing house keeping.  No f/c  Past Medical History:  Diagnosis Date   Vaginal Pap smear, abnormal     There are no problems to display for this patient.   History reviewed. No pertinent surgical history.  OB History     Gravida  1   Para      Term      Preterm      AB  1   Living         SAB  1   IAB      Ectopic      Multiple      Live Births               Home Medications    Prior to Admission medications   Medication Sig Start Date End Date Taking? Authorizing Provider  predniSONE (DELTASONE) 20 MG tablet Take 2 tablets (40 mg total) by mouth daily with breakfast for 5 days. 12/15/21 12/20/21 Yes Usha Slager, Janace Aris, MD  albuterol (VENTOLIN HFA) 108 (90 Base) MCG/ACT inhaler Inhale 1-2 puffs into the lungs every 6 (six) hours as needed for wheezing or shortness of breath. 10/18/21   Rushie Chestnut, PA-C  Prenatal Vit-Fe Fumarate-FA (PRENATAL PO) Take by mouth.    [provider]  sertraline (ZOLOFT) 50 MG tablet Take 1 tablet (50 mg total) by mouth daily. 09/14/20   Constant, Peggy, MD    Family History Family History  Problem Relation Age of Onset   Asthma Mother    Hypertension Father    Hypertension Paternal Uncle    Hypertension Paternal Grandmother    Cancer Paternal Grandmother    Hypertension Paternal Grandfather     Social History Social History   Tobacco Use   Smoking status: Every Day    Packs/day: 1.00    Years:  14.00    Pack years: 14.00    Types: Cigarettes   Smokeless tobacco: Never  Vaping Use   Vaping Use: Never used  Substance Use Topics   Alcohol use: Never   Drug use: Yes    Types: Marijuana     Allergies   Penicillins   Review of Systems Review of Systems   Physical Exam Triage Vital Signs ED Triage Vitals [12/15/21 1348]  Enc Vitals Group     BP 128/87     Pulse Rate 88     Resp 17     Temp 98.4 F (36.9 C)     Temp Source Oral     SpO2 97 %     Weight      Height      Head Circumference      Peak Flow      Pain Score 8     Pain Loc      Pain Edu?      Excl. in GC?    No data found.  Updated  Vital Signs BP 128/87 (BP Location: Left Arm)    Pulse 88    Temp 98.4 F (36.9 C) (Oral)    Resp 17    LMP 12/09/2021    SpO2 97%   Visual Acuity Right Eye Distance:   Left Eye Distance:   Bilateral Distance:    Right Eye Near:   Left Eye Near:    Bilateral Near:     Physical Exam Vitals reviewed.  Constitutional:      General: She is not in acute distress.    Appearance: She is not toxic-appearing.  Cardiovascular:     Rate and Rhythm: Normal rate and regular rhythm.  Pulmonary:     Effort: Pulmonary effort is normal.     Breath sounds: Normal breath sounds.  Musculoskeletal:     Comments: Has some swelling over the dorsum of her right wrist and hand, and is tender. NO induration, and no erythema or warmth. Neurovascular intact  Neurological:     General: No focal deficit present.     Mental Status: She is alert and oriented to person, place, and time.  Psychiatric:        Behavior: Behavior normal.     UC Treatments / Results  Labs (all labs ordered are listed, but only abnormal results are displayed) Labs Reviewed - No data to display  EKG   Radiology DG Wrist Complete Right  Result Date: 12/15/2021 CLINICAL DATA:  Right wrist and metacarpal pain.  Swelling. EXAM: RIGHT WRIST - COMPLETE 3+ VIEW COMPARISON:  None. FINDINGS: There is no  evidence of fracture or dislocation. There is no evidence of arthropathy or other focal bone abnormality. Soft tissues are unremarkable. IMPRESSION: Negative. Electronically Signed   By: Sebastian Ache M.D.   On: 12/15/2021 14:22    Procedures Procedures (including critical care time)  Medications Ordered in UC Medications - No data to display  Initial Impression / Assessment and Plan / UC Course  I have reviewed the triage vital signs and the nursing notes.  Pertinent labs & imaging results that were available during my care of the patient were reviewed by me and considered in my medical decision making (see chart for details).     Xray negative for fracture. Will treat with steroids for prob tendonitis, have her use a wrist splint, and have her rest/elevate and ice. Final Clinical Impressions(s) / UC Diagnoses   Final diagnoses:  Right wrist pain  Tendonitis     Discharge Instructions      Prednisone 20 mg, take 2 daily for 5 days.  Ice and elevate your sore wrist.      ED Prescriptions     Medication Sig Dispense Auth. Provider   predniSONE (DELTASONE) 20 MG tablet Take 2 tablets (40 mg total) by mouth daily with breakfast for 5 days. 10 tablet Marlinda Mike Janace Aris, MD      PDMP not reviewed this encounter.   Zenia Resides, MD 12/15/21 1438    Zenia Resides, MD 12/15/21 [id number]

## 2021-12-15 NOTE — ED Triage Notes (Signed)
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 Yesterday having pains radiating from right forearm to hand. Woke up with swelling today. Reports very painful.

## 2022-02-12 ENCOUNTER — Encounter (HOSPITAL_COMMUNITY): Payer: Self-pay

## 2022-02-12 ENCOUNTER — Ambulatory Visit (HOSPITAL_COMMUNITY)
Admission: EM | Admit: 2022-02-12 | Discharge: 2022-02-12 | Disposition: A | Discharge status: Home / Self Care | Payer: Commercial Managed Care - PPO | Attending: Physician Assistant | Admitting: Physician Assistant

## 2022-02-12 ENCOUNTER — Other Ambulatory Visit: Payer: Self-pay

## 2022-02-12 DIAGNOSIS — J069 Acute upper respiratory infection, unspecified: Secondary | ICD-10-CM | POA: Diagnosis not present

## 2022-02-12 DIAGNOSIS — U071 COVID-19: Secondary | ICD-10-CM | POA: Insufficient documentation

## 2022-02-12 DIAGNOSIS — Z8616 Personal history of COVID-19: Secondary | ICD-10-CM | POA: Diagnosis not present

## 2022-02-12 DIAGNOSIS — R52 Pain, unspecified: Secondary | ICD-10-CM

## 2022-02-12 DIAGNOSIS — R5381 Other malaise: Secondary | ICD-10-CM | POA: Diagnosis present

## 2022-02-12 DIAGNOSIS — R051 Acute cough: Secondary | ICD-10-CM

## 2022-02-12 LAB — POC INFLUENZA A AND B ANTIGEN (URGENT CARE ONLY)
INFLUENZA A ANTIGEN, POC: NEGATIVE
INFLUENZA B ANTIGEN, POC: NEGATIVE

## 2022-02-12 LAB — SARS CORONAVIRUS 2 (TAT 6-24 HRS): SARS Coronavirus 2: POSITIVE — AB

## 2022-02-12 MED ORDER — ALBUTEROL SULFATE HFA 108 (90 BASE) MCG/ACT IN AERS
INHALATION_SPRAY | RESPIRATORY_TRACT | Status: AC
  Filled 2022-02-12: qty 6.7

## 2022-02-12 MED ORDER — ALBUTEROL SULFATE HFA 108 (90 BASE) MCG/ACT IN AERS
2.0000 | INHALATION_SPRAY | Freq: Once | RESPIRATORY_TRACT | Status: AC
  Administered 2022-02-12: 2 via RESPIRATORY_TRACT

## 2022-02-12 MED ORDER — PROMETHAZINE-DM 6.25-15 MG/5ML PO SYRP: 5.0000 mL | ORAL_SOLUTION | Freq: Four times a day (QID) | ORAL | 0 refills | Status: DC | PRN

## 2022-02-12 MED ORDER — IBUPROFEN 800 MG PO TABS: 800.0000 mg | ORAL_TABLET | Freq: Three times a day (TID) | ORAL | 0 refills | Status: DC | PRN

## 2022-02-12 NOTE — ED Triage Notes (Signed)
Pt presents for body ache, fever and fatigue x 2 days.  ? ?

## 2022-02-12 NOTE — Discharge Instructions (Signed)
Your flu test was negative.  We will contact you if your COVID test is positive.  Use albuterol every 4-6 hours as needed for shortness of breath and coughing symptoms.  Take Promethazine DM up to 4 times a day for cough.  This will make you sleepy so do not drive or drink alcohol while taking it.  I have called in ibuprofen 800 mg.  You should not take any additional NSAIDs including aspirin, ibuprofen/Advil, naproxen/Aleve with this medication.  If you have any signs of bleeding including severe abdominal pain, blood in your stool, dark stools you need to be seen immediately.  Use Tylenol and Mucinex for additional symptom relief.  Make sure you rest and drink plenty of fluid.  If your symptoms or not improving by next week return here.  If anything worsens and you have high fever, chest pain, shortness of breath, nausea/vomiting interfering with oral intake you should be seen immediately. ?

## 2022-02-12 NOTE — ED Provider Notes (Signed)
[facility]    CSN: [id number] Arrival date & time: 02/12/22  1125      History   Chief Complaint No chief complaint on file.   HPI Shannon Carlson is a 36 y.o. female.   Patient presents today with a 2-day history of URI symptoms.  She reports high fever with Tmax of 101 F, body aches, cough, nasal congestion, fatigue, malaise.  She denies any chest pain, shortness of breath.  She does have a history of recurrent bronchitis and has used albuterol inhaler with temporary relief of symptoms.  She has also tried NyQuil which improved fever and allowed her sleep but did not resolve symptoms.  She denies history of allergies, asthma, COPD, smoking.  She is confident she is not pregnant.  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  She has had COVID in the past with last episode approximately 1 year ago.  She has not had her influenza vaccine.  Denies any recent antibiotic use.   Past Medical History:  Diagnosis Date   Vaginal Pap smear, abnormal     There are no problems to display for this patient.   History reviewed. No pertinent surgical history.  OB History     Gravida  1   Para      Term      Preterm      AB  1   Living         SAB  1   IAB      Ectopic      Multiple      Live Births               Home Medications    Prior to Admission medications   Medication Sig Start Date End Date Taking? Authorizing Provider  ibuprofen (ADVIL) 800 MG tablet Take 1 tablet (800 mg total) by mouth every 8 (eight) hours as needed. 02/12/22  Yes Rashunda Passon K, PA-C  promethazine-dextromethorphan (PROMETHAZINE-DM) 6.25-15 MG/5ML syrup Take 5 mLs by mouth 4 (four) times daily as needed for cough. 02/12/22  Yes Irianna Gilday K, PA-C  albuterol (VENTOLIN HFA) 108 (90 Base) MCG/ACT inhaler Inhale 1-2 puffs into the lungs every 6 (six) hours as needed for wheezing or shortness of breath. 10/18/21    Rushie Chestnut, PA-C  Prenatal Vit-Fe Fumarate-FA (PRENATAL PO) Take by mouth.    [provider]  sertraline (ZOLOFT) 50 MG tablet Take 1 tablet (50 mg total) by mouth daily. 09/14/20   Constant, Peggy, MD    Family History Family History  Problem Relation Age of Onset   Asthma Mother    Hypertension Father    Hypertension Paternal Uncle    Hypertension Paternal Grandmother    Cancer Paternal Grandmother    Hypertension Paternal Grandfather     Social History Social History   Tobacco Use   Smoking status: Every Day    Packs/day: 1.00    Years: 14.00    Pack years: 14.00    Types: Cigarettes   Smokeless tobacco: Never  Vaping Use   Vaping Use: Never used  Substance Use Topics   Alcohol use: Never   Drug use: Yes    Types: Marijuana     Allergies   Penicillins   Review of Systems Review of Systems  Constitutional:  Positive for activity change, appetite change, fatigue and fever.  HENT:  Positive for congestion and sore throat. Negative for  sinus pressure and sneezing.   Respiratory:  Positive for cough. Negative for shortness of breath.   Cardiovascular:  Negative for chest pain.  Gastrointestinal:  Positive for nausea and vomiting. Negative for abdominal pain and diarrhea.  Musculoskeletal:  Positive for arthralgias and myalgias.  Neurological:  Positive for headaches. Negative for dizziness and light-headedness.    Physical Exam Triage Vital Signs ED Triage Vitals [02/12/22 1232]  Enc Vitals Group     BP 122/78     Pulse Rate 91     Resp 16     Temp 98.4 F (36.9 C)     Temp Source Oral     SpO2 100 %     Weight      Height      Head Circumference      Peak Flow      Pain Score      Pain Loc      Pain Edu?      Excl. in GC?    No data found.  Updated Vital Signs BP 122/78 (BP Location: Left Arm)    Pulse 91    Temp 98.4 F (36.9 C) (Oral)    Resp 16    LMP 02/07/2022 (Approximate)    SpO2 100%   Visual Acuity Right Eye  Distance:   Left Eye Distance:   Bilateral Distance:    Right Eye Near:   Left Eye Near:    Bilateral Near:     Physical Exam Vitals reviewed.  Constitutional:      General: She is awake. She is not in acute distress.    Appearance: Normal appearance. She is well-developed. She is not ill-appearing.     Comments: Very pleasant female appears stated age in no acute distress sitting comfortably in exam room  HENT:     Head: Normocephalic and atraumatic.     Right Ear: Tympanic membrane, ear canal and external ear normal. Tympanic membrane is not erythematous or bulging.     Left Ear: Tympanic membrane, ear canal and external ear normal. Tympanic membrane is not erythematous or bulging.     Mouth/Throat:     Pharynx: Uvula midline. Posterior oropharyngeal erythema present. No oropharyngeal exudate.  Cardiovascular:     Rate and Rhythm: Normal rate and regular rhythm.     Heart sounds: Normal heart sounds, S1 normal and S2 normal. No murmur heard. Pulmonary:     Effort: Pulmonary effort is normal.     Breath sounds: Wheezing present. No rhonchi or rales.  Psychiatric:        Behavior: Behavior is cooperative.     UC Treatments / Results  Labs (all labs ordered are listed, but only abnormal results are displayed) Labs Reviewed  SARS CORONAVIRUS 2 (TAT 6-24 HRS)  POC INFLUENZA A AND B ANTIGEN (URGENT CARE ONLY)    EKG   Radiology No results found.  Procedures Procedures (including critical care time)  Medications Ordered in UC Medications  albuterol (VENTOLIN HFA) 108 (90 Base) MCG/ACT inhaler 2 puff (2 puffs Inhalation Given 02/12/22 1338)    Initial Impression / Assessment and Plan / UC Course  I have reviewed the triage vital signs and the nursing notes.  Pertinent labs & imaging results that were available during my care of the patient were reviewed by me and considered in my medical decision making (see chart for details).     Patient is well-appearing,  afebrile, nontoxic, nontachycardic.  Chest x-ray was deferred as wheezing improved with albuterol and  oxygen saturation was 100%.  No evidence of acute infection on physical exam that would warrant initiation of antibiotics.  Discussed likely viral etiology.  Flu testing was negative.  COVID test is pending.  Patient was given work excuse note with current CDC return to work guidelines based on COVID test result.  She was given Promethazine DM for cough with instruction not to drive or drink alcohol while taking this medication as drowsiness is a common side effect.  She was given ibuprofen 800 mg for pain with instruction not to take NSAIDs with this medication and monitor for any signs of bleeding.  She is to use Mucinex and Flonase for symptom relief.  Recommended rest and drinking plenty of fluid.  If symptoms or not improving by next week she is to return here or see her PCP.  If she has any worsening symptoms she is to be seen immediately.  Strict return precautions given to which she expressed understanding.  Final Clinical Impressions(s) / UC Diagnoses   Final diagnoses:  Upper respiratory tract infection, unspecified type  Body aches  Acute cough     Discharge Instructions      Your flu test was negative.  We will contact you if your COVID test is positive.  Use albuterol every 4-6 hours as needed for shortness of breath and coughing symptoms.  Take Promethazine DM up to 4 times a day for cough.  This will make you sleepy so do not drive or drink alcohol while taking it.  I have called in ibuprofen 800 mg.  You should not take any additional NSAIDs including aspirin, ibuprofen/Advil, naproxen/Aleve with this medication.  If you have any signs of bleeding including severe abdominal pain, blood in your stool, dark stools you need to be seen immediately.  Use Tylenol and Mucinex for additional symptom relief.  Make sure you rest and drink plenty of fluid.  If your symptoms or not improving by  next week return here.  If anything worsens and you have high fever, chest pain, shortness of breath, nausea/vomiting interfering with oral intake you should be seen immediately.     ED Prescriptions     Medication Sig Dispense Auth. Provider   promethazine-dextromethorphan (PROMETHAZINE-DM) 6.25-15 MG/5ML syrup Take 5 mLs by mouth 4 (four) times daily as needed for cough. 118 mL Roseanne Juenger K, PA-C   ibuprofen (ADVIL) 800 MG tablet Take 1 tablet (800 mg total) by mouth every 8 (eight) hours as needed. 21 tablet Meghin Thivierge, Noberto Retort, PA-C      PDMP not reviewed this encounter.   Jeani Hawking, PA-C 02/12/22 1409

## 2022-03-12 ENCOUNTER — Emergency Department (INDEPENDENT_AMBULATORY_CARE_PROVIDER_SITE_OTHER): Payer: Commercial Managed Care - PPO

## 2022-03-12 ENCOUNTER — Emergency Department (INDEPENDENT_AMBULATORY_CARE_PROVIDER_SITE_OTHER)
Admission: RE | Admit: 2022-03-12 | Discharge: 2022-03-12 | Disposition: A | Discharge status: Home / Self Care | Payer: Commercial Managed Care - PPO | Source: Ambulatory Visit

## 2022-03-12 VITALS — BP 122/84 | HR 107 | Temp 98.1°F | Resp 20 | Ht 67.0 in | Wt 260.0 lb

## 2022-03-12 DIAGNOSIS — U071 COVID-19: Secondary | ICD-10-CM | POA: Diagnosis not present

## 2022-03-12 DIAGNOSIS — R059 Cough, unspecified: Secondary | ICD-10-CM

## 2022-03-12 HISTORY — DX: Anxiety disorder, unspecified: F41.9

## 2022-03-12 HISTORY — DX: Depression, unspecified: F32.A

## 2022-03-12 LAB — POCT URINE PREGNANCY: Preg Test, Ur: NEGATIVE

## 2022-03-12 MED ORDER — METHYLPREDNISOLONE 4 MG PO TBPK: ORAL_TABLET | ORAL | 0 refills | Status: DC

## 2022-03-12 MED ORDER — AZITHROMYCIN 250 MG PO TABS: 250.0000 mg | ORAL_TABLET | Freq: Every day | ORAL | 0 refills | Status: DC

## 2022-03-12 MED ORDER — METHYLPREDNISOLONE SODIUM SUCC 125 MG IJ SOLR
125.0000 mg | Freq: Once | INTRAMUSCULAR | Status: AC
  Administered 2022-03-12: 125 mg via INTRAMUSCULAR

## 2022-03-12 MED ORDER — FEXOFENADINE HCL 180 MG PO TABS: 180.0000 mg | ORAL_TABLET | Freq: Every day | ORAL | 0 refills | Status: DC

## 2022-03-12 NOTE — Discharge Instructions (Addendum)
Advised/informed patient of chest x-ray results this evening with hard copy provided to patient.  Advised patient to take medication as directed with food to completion.  Advised patient to start medication tomorrow morning and to take Medrol Dosepak and Allegra with Zithromax daily for the next 5 days.  Encouraged patient to increase daily water intake while taking these medications.  Advised patient if symptoms worsen and/or unresolved please follow-up with your PCP or here for further evaluation. ?

## 2022-03-12 NOTE — ED Triage Notes (Signed)
Pt presents to Urgent Care with c/o cough, wheezing, and sob since being dx w/ COVID on 02/13/22. Reports that nebulizer treatments and inhaler are not helping. PCP appt tomorrow--was advised to come to [facility] for CXR.  ?

## 2022-03-12 NOTE — ED Provider Notes (Signed)
?[facility] ? ? ? ?CSN: [id number] ?Arrival date & time: 03/12/22  1812 ? ? ?  ? ?History   ?Chief Complaint ?Chief Complaint  ?Patient presents with  ? Wheezing  ? Cough  ? Shortness of Breath  ? ? ?HPI ?Shannon Carlson is a 36 y.o. female.  ? ?HPI 36 year old female presents with cough wheezing and shortness of breath since diagnosed with COVID-19 on 02/13/22.  Patient reports that nebulizer treatments and inhaler are not helping.  Patient reports that she will still has a appointment with her PCP tomorrow; however, was advised to come to urgent care for chest x-ray.  PMH significant for obesity, anxiety, and depression.  Patient is a current daily cigarette smoker 1 pack/day with 14-pack-year history. ? ?Past Medical History:  ?Diagnosis Date  ? Anxiety   ? Depression   ? Vaginal Pap smear, abnormal   ? ? ?There are no problems to display for this patient. ? ? ?History reviewed. No pertinent surgical history. ? ?OB History   ? ? Gravida  ?1  ? Para  ?   ? Term  ?   ? Preterm  ?   ? AB  ?1  ? Living  ?   ?  ? ? SAB  ?1  ? IAB  ?   ? Ectopic  ?   ? Multiple  ?   ? Live Births  ?   ?   ?  ?  ? ? ? ?Home Medications   ? ?Prior to Admission medications   ?Medication Sig Start Date End Date Taking? Authorizing Provider  ?azithromycin (ZITHROMAX) 250 MG tablet Take 1 tablet (250 mg total) by mouth daily. Take first 2 tablets together, then 1 every day until finished. 03/12/22  Yes Trevor Iha, FNP  ?fexofenadine (ALLEGRA ALLERGY) 180 MG tablet Take 1 tablet (180 mg total) by mouth daily for 15 days. 03/12/22 03/27/22 Yes Trevor Iha, FNP  ?guaiFENesin (ROBITUSSIN) 100 MG/5ML liquid Take 5 mLs by mouth every 4 (four) hours as needed for cough or to loosen phlegm.   Yes [provider]  ?methylPREDNISolone (MEDROL DOSEPAK) 4 MG TBPK tablet Take as directed 03/12/22  Yes Trevor Iha, FNP  ?albuterol (VENTOLIN HFA) 108 (90 Base) MCG/ACT inhaler Inhale 1-2 puffs into the lungs every 6 (six)  hours as needed for wheezing or shortness of breath. 10/18/21   Rushie Chestnut, PA-C  ?ibuprofen (ADVIL) 800 MG tablet Take 1 tablet (800 mg total) by mouth every 8 (eight) hours as needed. 02/12/22   Raspet, Noberto Retort, PA-C  ?Prenatal Vit-Fe Fumarate-FA (PRENATAL PO) Take by mouth.    [provider]  ?sertraline (ZOLOFT) 50 MG tablet Take 1 tablet (50 mg total) by mouth daily. 09/14/20   Constant, Gigi Gin, MD  ? ? ?Family History ?Family History  ?Problem Relation Age of Onset  ? Asthma Mother   ? Hypertension Father   ? Heart attack Father   ? Hypertension Paternal Grandmother   ? Cancer Paternal Grandmother   ? Hypertension Paternal Grandfather   ? Hypertension Paternal Uncle   ? ? ?Social History ?Social History  ? ?Tobacco Use  ? Smoking status: Every Day  ?  Packs/day: 1.00  ?  Years: 14.00  ?  Pack years: 14.00  ?  Types: Cigarettes  ? Smokeless tobacco: Never  ?Vaping Use  ? Vaping Use: Never used  ?Substance Use Topics  ? Alcohol use: Yes  ?  Comment: occasionally  ? Drug use: Not  Currently  ? ? ? ?Allergies   ?Penicillins ? ? ?Review of Systems ?Review of Systems  ?Respiratory:  Positive for cough, shortness of breath and wheezing.   ?All other systems reviewed and are negative. ? ? ?Physical Exam ?Triage Vital Signs ?ED Triage Vitals  ?Enc Vitals Group  ?   BP 03/12/22 1840 122/84  ?   Pulse Rate 03/12/22 1840 (!) 107  ?   Resp 03/12/22 1840 20  ?   Temp 03/12/22 1840 98.1 ?F (36.7 ?C)  ?   Temp Source 03/12/22 1840 Oral  ?   SpO2 03/12/22 1840 97 %  ?   Weight 03/12/22 1833 260 lb (117.9 kg)  ?   Height 03/12/22 1833 5\' 7"  (1.702 m)  ?   Head Circumference --   ?   Peak Flow --   ?   Pain Score 03/12/22 1833 0  ?   Pain Loc --   ?   Pain Edu? --   ?   Excl. in GC? --   ? ?No data found. ? ?Updated Vital Signs ?BP 122/84 (BP Location: Right Arm)   Pulse (!) 107   Temp 98.1 ?F (36.7 ?C) (Oral)   Resp 20   Ht 5\' 7"  (1.702 m)   Wt 260 lb (117.9 kg)   LMP 02/07/2022 (Exact Date)   SpO2 97%   BMI  40.72 kg/m?  ? ?  ? ?Physical Exam ?Vitals and nursing note reviewed.  ?Constitutional:   ?   General: She is not in acute distress. ?   Appearance: Normal appearance. She is obese. She is not ill-appearing.  ?HENT:  ?   Head: Normocephalic and atraumatic.  ?   Right Ear: Tympanic membrane, ear canal and external ear normal.  ?   Left Ear: Tympanic membrane, ear canal and external ear normal.  ?   Mouth/Throat:  ?   Mouth: Mucous membranes are moist.  ?   Pharynx: Oropharynx is clear.  ?Eyes:  ?   Extraocular Movements: Extraocular movements intact.  ?   Conjunctiva/sclera: Conjunctivae normal.  ?   Pupils: Pupils are equal, round, and reactive to light.  ?Cardiovascular:  ?   Rate and Rhythm: Normal rate and regular rhythm.  ?   Pulses: Normal pulses.  ?   Heart sounds: Normal heart sounds. No murmur heard. ?Pulmonary:  ?   Effort: Pulmonary effort is normal.  ?   Breath sounds: Normal breath sounds. No wheezing, rhonchi or rales.  ?Musculoskeletal:  ?   Cervical back: Normal range of motion and neck supple.  ?Skin: ?   General: Skin is warm and dry.  ?Neurological:  ?   General: No focal deficit present.  ?   Mental Status: She is alert and oriented to person, place, and time.  ? ? ? ?UC Treatments / Results  ?Labs ?(all labs ordered are listed, but only abnormal results are displayed) ?Labs Reviewed  ?POCT URINE PREGNANCY  ? ? ?EKG ? ? ?Radiology ?DG Chest 2 View ? ?Result Date: 03/12/2022 ?CLINICAL DATA:  COVID, wheezing. EXAM: CHEST - 2 VIEW COMPARISON:  Chest x-ray 10/18/2021. FINDINGS: The heart size and mediastinal contours are within normal limits. Both lungs are clear. The visualized skeletal structures are unremarkable. IMPRESSION: No active cardiopulmonary disease. Electronically Signed   By: 05/12/2022 M.D.   On: 03/12/2022 19:05   ? ?Procedures ?Procedures (including critical care time) ? ?Medications Ordered in UC ?Medications  ?methylPREDNISolone sodium succinate (SOLU-MEDROL) 125 mg/2 mL  injection  125 mg (125 mg Intramuscular Given 03/12/22 1922)  ? ? ?Initial Impression / Assessment and Plan / UC Course  ?I have reviewed the triage vital signs and the nursing notes. ? ?Pertinent labs & imaging results that were available during my care of the patient were reviewed by me and considered in my medical decision making (see chart for details). ? ?  ? ?MDM: 1.  Cough-CXR was negative for acute cardiopulmonary process, Rx'd Zithromax and Medrol Dosepak; 2.  Wheezes-Solu-Medrol IM 125 mg given once in clinic prior to discharge. Advised/informed patient of chest x-ray results this evening with hard copy provided to patient.  Advised patient to take medication as directed with food to completion.  Advised patient to start medication tomorrow morning and to take Medrol Dosepak and Allegra with Zithromax daily for the next 5 days.  Encouraged patient to increase daily water intake while taking these medications.  Advised patient if symptoms worsen and/or unresolved please follow-up with your PCP or here for further evaluation.  Patient discharged home, hemodynamically stable. ?Final Clinical Impressions(s) / UC Diagnoses  ? ?Final diagnoses:  ?Cough, unspecified type  ? ? ? ?Discharge Instructions   ? ?  ?Advised/informed patient of chest x-ray results this evening with hard copy provided to patient.  Advised patient to take medication as directed with food to completion.  Advised patient to start medication tomorrow morning and to take Medrol Dosepak and Allegra with Zithromax daily for the next 5 days.  Encouraged patient to increase daily water intake while taking these medications.  Advised patient if symptoms worsen and/or unresolved please follow-up with your PCP or here for further evaluation. ? ? ? ? ?ED Prescriptions   ? ? Medication Sig Dispense Auth. Provider  ? azithromycin (ZITHROMAX) 250 MG tablet Take 1 tablet (250 mg total) by mouth daily. Take first 2 tablets together, then 1 every day until finished. 6  tablet Trevor Ihaagan, Shekia Kuper, FNP  ? methylPREDNISolone (MEDROL DOSEPAK) 4 MG TBPK tablet Take as directed 1 each Trevor Ihaagan, Sevrin Sally, FNP  ? fexofenadine (ALLEGRA ALLERGY) 180 MG tablet Take 1 tablet (180 mg total) by mouth

## 2022-06-24 ENCOUNTER — Ambulatory Visit (INDEPENDENT_AMBULATORY_CARE_PROVIDER_SITE_OTHER): Payer: Commercial Managed Care - PPO

## 2022-06-24 ENCOUNTER — Ambulatory Visit (HOSPITAL_COMMUNITY)
Admission: EM | Admit: 2022-06-24 | Discharge: 2022-06-24 | Disposition: A | Discharge status: Home / Self Care | Payer: Commercial Managed Care - PPO | Attending: Family Medicine | Admitting: Family Medicine

## 2022-06-24 ENCOUNTER — Encounter (HOSPITAL_COMMUNITY): Payer: Self-pay

## 2022-06-24 DIAGNOSIS — M545 Low back pain, unspecified: Secondary | ICD-10-CM

## 2022-06-24 MED ORDER — KETOROLAC TROMETHAMINE 30 MG/ML IJ SOLN
INTRAMUSCULAR | Status: AC
  Filled 2022-06-24: qty 1

## 2022-06-24 MED ORDER — KETOROLAC TROMETHAMINE 30 MG/ML IJ SOLN
30.0000 mg | Freq: Once | INTRAMUSCULAR | Status: AC
  Administered 2022-06-24: 30 mg via INTRAMUSCULAR

## 2022-06-24 MED ORDER — IBUPROFEN 800 MG PO TABS: 800.0000 mg | ORAL_TABLET | Freq: Three times a day (TID) | ORAL | 0 refills | Status: DC | PRN

## 2022-06-24 MED ORDER — TIZANIDINE HCL 4 MG PO TABS: 4.0000 mg | ORAL_TABLET | Freq: Three times a day (TID) | ORAL | 0 refills | Status: DC | PRN

## 2022-06-24 NOTE — Discharge Instructions (Addendum)
You have been given a shot of Toradol 30 mg today.  Take ibuprofen 800 mg--1 tab every 8 hours as needed for pain.   Take tizanidine 4 mg--1 every 8 hours as needed for muscle spasms  Heat or ice to the sore areas can help
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
## 2022-09-08 ENCOUNTER — Ambulatory Visit (HOSPITAL_COMMUNITY)
Admission: EM | Admit: 2022-09-08 | Discharge: 2022-09-08 | Disposition: A | Discharge status: Home / Self Care | Payer: Commercial Managed Care - PPO | Attending: Physician Assistant | Admitting: Physician Assistant

## 2022-09-08 ENCOUNTER — Encounter (HOSPITAL_COMMUNITY): Payer: Self-pay

## 2022-09-08 DIAGNOSIS — M5442 Lumbago with sciatica, left side: Secondary | ICD-10-CM | POA: Diagnosis not present

## 2022-09-08 DIAGNOSIS — G8929 Other chronic pain: Secondary | ICD-10-CM

## 2022-09-08 MED ORDER — DICLOFENAC SODIUM 75 MG PO TBEC: 75.0000 mg | DELAYED_RELEASE_TABLET | Freq: Two times a day (BID) | ORAL | 0 refills | Status: DC

## 2022-09-08 MED ORDER — METHOCARBAMOL 500 MG PO TABS: 500.0000 mg | ORAL_TABLET | Freq: Four times a day (QID) | ORAL | 0 refills | Status: DC

## 2022-09-08 NOTE — Discharge Instructions (Addendum)
Follow up with Orthopaedist if pain persit
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
## 2022-12-05 ENCOUNTER — Ambulatory Visit
Admission: EM | Admit: 2022-12-05 | Discharge: 2022-12-05 | Disposition: A | Discharge status: Home / Self Care | Payer: Commercial Managed Care - PPO | Attending: Family Medicine | Admitting: Family Medicine

## 2022-12-05 DIAGNOSIS — J111 Influenza due to unidentified influenza virus with other respiratory manifestations: Secondary | ICD-10-CM

## 2022-12-05 LAB — POCT INFLUENZA A/B
Influenza A, POC: NEGATIVE
Influenza B, POC: POSITIVE — AB

## 2022-12-05 LAB — POC SARS CORONAVIRUS 2 AG -  ED: SARS Coronavirus 2 Ag: NEGATIVE

## 2022-12-05 MED ORDER — OSELTAMIVIR PHOSPHATE 75 MG PO CAPS: 75.0000 mg | ORAL_CAPSULE | Freq: Two times a day (BID) | ORAL | 0 refills | Status: DC

## 2022-12-05 NOTE — ED Triage Notes (Signed)
Pt presents with c/o cough, body aches, and fever that began 12/25

## 2022-12-05 NOTE — Discharge Instructions (Signed)
Take Tamiflu every 12 hours for 5 days Drink lots of water Take Tylenol or ibuprofen for pain and fever May take over-the-counter cough or cold medicines Will stay home until you are fever has been gone for 24 hours and your symptoms are improved

## 2022-12-05 NOTE — ED Provider Notes (Signed)
[name]    CSN: [id number] Arrival date & time: 12/05/22  1511      History   Chief Complaint Chief Complaint  Patient presents with   Cough   Fever    HPI Shannon Carlson is a 36 y.o. female.   HPI  Patient has cough body aches headache fever that started on 1220 5 in the afternoon.  Today is day 2 of illness.  She states that she has a lot of body aches, "everything hurts", exhaustion, cough.  No one else at home is sick  Past Medical History:  Diagnosis Date   Anxiety    Depression    Vaginal Pap smear, abnormal     There are no problems to display for this patient.   History reviewed. No pertinent surgical history.  OB History     Gravida  1   Para      Term      Preterm      AB  1   Living         SAB  1   IAB      Ectopic      Multiple      Live Births               Home Medications    Prior to Admission medications   Medication Sig Start Date End Date Taking? Authorizing Provider  oseltamivir (TAMIFLU) 75 MG capsule Take 1 capsule (75 mg total) by mouth every 12 (twelve) hours. 12/05/22  Yes Eustace Moore, MD  albuterol (VENTOLIN HFA) 108 (90 Base) MCG/ACT inhaler Inhale 1-2 puffs into the lungs every 6 (six) hours as needed for wheezing or shortness of breath. 10/18/21   Rushie Chestnut, PA-C  ibuprofen (ADVIL) 800 MG tablet Take 1 tablet (800 mg total) by mouth every 8 (eight) hours as needed (pain). 06/24/22   Zenia Resides, MD  Prenatal Vit-Fe Fumarate-FA (PRENATAL PO) Take by mouth.    [provider]  sertraline (ZOLOFT) 50 MG tablet Take 1 tablet (50 mg total) by mouth daily. 09/14/20   Constant, Peggy, MD    Family History Family History  Problem Relation Age of Onset   Asthma Mother    Hypertension Father    Heart attack Father    Hypertension Paternal Grandmother    Cancer Paternal Grandmother    Hypertension Paternal Grandfather    Hypertension Paternal Uncle      Social History Social History   Tobacco Use   Smoking status: Former    Packs/day: 1.00    Years: 14.00    Total pack years: 14.00    Types: Cigarettes   Smokeless tobacco: Never  Vaping Use   Vaping Use: Never used  Substance Use Topics   Alcohol use: Yes    Comment: occasionally   Drug use: Not Currently     Allergies   Penicillins   Review of Systems Review of Systems See HPI  Physical Exam Triage Vital Signs ED Triage Vitals  Enc Vitals Group     BP 12/05/22 1639 116/80     Pulse Rate 12/05/22 1639 (!) 109     Resp 12/05/22 1639 14     Temp 12/05/22 1639 99.9 F (37.7 C)     Temp Source 12/05/22 1639 Oral     SpO2 12/05/22 1639 96 %     Weight --      Height --      Head Circumference --  Peak Flow --      Pain Score 12/05/22 1641 10     Pain Loc --      Pain Edu? --      Excl. in GC? --    No data found.  Updated Vital Signs BP 116/80 (BP Location: Right Arm)   Pulse (!) 109   Temp 99.9 F (37.7 C) (Oral)   Resp 14   SpO2 96%       Physical Exam Constitutional:      General: She is not in acute distress.    Appearance: She is well-developed. She is ill-appearing.  HENT:     Head: Normocephalic and atraumatic.     Nose: Rhinorrhea present.     Mouth/Throat:     Mouth: Mucous membranes are moist.     Pharynx: Posterior oropharyngeal erythema present.  Eyes:     Conjunctiva/sclera: Conjunctivae normal.     Pupils: Pupils are equal, round, and reactive to light.  Cardiovascular:     Rate and Rhythm: Normal rate.  Pulmonary:     Effort: Pulmonary effort is normal. No respiratory distress.  Abdominal:     General: There is no distension.     Palpations: Abdomen is soft.  Musculoskeletal:        General: Normal range of motion.     Cervical back: Normal range of motion.  Skin:    General: Skin is warm and dry.  Neurological:     Mental Status: She is alert.      UC Treatments / Results  Labs (all labs ordered are  listed, but only abnormal results are displayed) Labs Reviewed  POCT INFLUENZA A/B - Abnormal; Notable for the following components:      Result Value   Influenza B, POC Positive (*)    All other components within normal limits  POC SARS CORONAVIRUS 2 AG -  ED    EKG   Radiology No results found.  Procedures Procedures (including critical care time)  Medications Ordered in UC Medications - No data to display  Initial Impression / Assessment and Plan / UC Course  I have reviewed the triage vital signs and the nursing notes.  Pertinent labs & imaging results that were available during my care of the patient were reviewed by me and considered in my medical decision making (see chart for details).     Final Clinical Impressions(s) / UC Diagnoses   Final diagnoses:  Influenza     Discharge Instructions      Take Tamiflu every 12 hours for 5 days Drink lots of water Take Tylenol or ibuprofen for pain and fever May take over-the-counter cough or cold medicines Will stay home until you are fever has been gone for 24 hours and your symptoms are improved   ED Prescriptions     Medication Sig Dispense Auth. Provider   oseltamivir (TAMIFLU) 75 MG capsule Take 1 capsule (75 mg total) by mouth every 12 (twelve) hours. 10 capsule Eustace Moore, MD      PDMP not reviewed this encounter.   Eustace Moore, MD 12/05/22 [phone]

## 2022-12-06 ENCOUNTER — Telehealth: Payer: Self-pay

## 2022-12-06 NOTE — Telephone Encounter (Signed)
TCT pt to follow up from recent visit. HIPAA compliant vm left for return call. 

## 2022-12-13 ENCOUNTER — Ambulatory Visit (INDEPENDENT_AMBULATORY_CARE_PROVIDER_SITE_OTHER): Payer: Commercial Managed Care - PPO

## 2022-12-13 ENCOUNTER — Ambulatory Visit
Admission: EM | Admit: 2022-12-13 | Discharge: 2022-12-13 | Disposition: A | Discharge status: Home / Self Care | Payer: Commercial Managed Care - PPO | Attending: Internal Medicine | Admitting: Internal Medicine

## 2022-12-13 DIAGNOSIS — R112 Nausea with vomiting, unspecified: Secondary | ICD-10-CM | POA: Diagnosis not present

## 2022-12-13 DIAGNOSIS — Z3202 Encounter for pregnancy test, result negative: Secondary | ICD-10-CM | POA: Diagnosis not present

## 2022-12-13 DIAGNOSIS — R062 Wheezing: Secondary | ICD-10-CM

## 2022-12-13 DIAGNOSIS — J111 Influenza due to unidentified influenza virus with other respiratory manifestations: Secondary | ICD-10-CM | POA: Diagnosis not present

## 2022-12-13 DIAGNOSIS — R059 Cough, unspecified: Secondary | ICD-10-CM

## 2022-12-13 DIAGNOSIS — R197 Diarrhea, unspecified: Secondary | ICD-10-CM

## 2022-12-13 LAB — POCT URINE PREGNANCY: Preg Test, Ur: NEGATIVE

## 2022-12-13 MED ORDER — ONDANSETRON 4 MG PO TBDP: 4.0000 mg | ORAL_TABLET | Freq: Three times a day (TID) | ORAL | 0 refills | Status: DC | PRN

## 2022-12-13 MED ORDER — ALBUTEROL SULFATE (2.5 MG/3ML) 0.083% IN NEBU
2.5000 mg | INHALATION_SOLUTION | Freq: Once | RESPIRATORY_TRACT | Status: AC
  Administered 2022-12-13: 2.5 mg via RESPIRATORY_TRACT

## 2022-12-13 NOTE — ED Triage Notes (Signed)
Pt c/o vomiting, chest pain, headache,   Reports she was dx w/ flu on 12/05/22 and was given tamiflu. Reports these GI sxs started b/c of the tamiflu.

## 2022-12-13 NOTE — ED Provider Notes (Addendum)
[facility]    CSN: [id number] Arrival date & time: 12/13/22  1430      History   Chief Complaint Chief Complaint  Patient presents with   tamiflu reaction    HPI Shannon Carlson is a 37 y.o. female.   Patient presents for further evaluation of persistent flu symptoms.  Patient tested positive for influenza on 12/05/2022 at previous urgent care visit.  She was prescribed Tamiflu.  Patient reports that she developed nausea, vomiting, diarrhea after taking Tamiflu but she did complete it.  Patient reports that she noticed a "blood clot" in her vomit 1 time but is not sure if this was due to to post tussive emesis or due to regular vomiting.  Denies blood in diarrhea.  Denies any associated abdominal pain.  Patient reports persistent cough as well.  Denies shortness of breath  but does report chest pain that occurs only when coughing.  Denies history of asthma but patient does smoke cigarettes.  Denies any known fevers.  Patient has not taken any other over-the-counter medications to alleviate symptoms.     Past Medical History:  Diagnosis Date   Anxiety    Depression    Vaginal Pap smear, abnormal     There are no problems to display for this patient.   History reviewed. No pertinent surgical history.  OB History     Gravida  1   Para      Term      Preterm      AB  1   Living         SAB  1   IAB      Ectopic      Multiple      Live Births               Home Medications    Prior to Admission medications   Medication Sig Start Date End Date Taking? Authorizing Provider  albuterol (VENTOLIN HFA) 108 (90 Base) MCG/ACT inhaler Inhale 1-2 puffs into the lungs every 6 (six) hours as needed for wheezing or shortness of breath. 10/18/21   Hughie Closs, PA-C  ibuprofen (ADVIL) 800 MG tablet Take 1 tablet (800 mg total) by mouth every 8 (eight) hours as needed (pain). 06/24/22   Barrett Henle, MD  ondansetron (ZOFRAN-ODT)  4 MG disintegrating tablet Take 1 tablet (4 mg total) by mouth every 8 (eight) hours as needed for nausea or vomiting. 12/13/22  Yes Teriah Muela, Michele Rockers, FNP  oseltamivir (TAMIFLU) 75 MG capsule Take 1 capsule (75 mg total) by mouth every 12 (twelve) hours. 12/05/22   Raylene Everts, MD  Prenatal Vit-Fe Fumarate-FA (PRENATAL PO) Take by mouth.    [provider]  sertraline (ZOLOFT) 50 MG tablet Take 1 tablet (50 mg total) by mouth daily. 09/14/20   Constant, Peggy, MD    Family History Family History  Problem Relation Age of Onset   Asthma Mother    Hypertension Father    Heart attack Father    Hypertension Paternal Grandmother    Cancer Paternal Grandmother    Hypertension Paternal Grandfather    Hypertension Paternal Uncle     Social History Social History   Tobacco Use   Smoking status: Former    Packs/day: 1.00    Years: 14.00    Total pack years: 14.00    Types: Cigarettes   Smokeless tobacco: Never  Vaping Use   Vaping Use: Never used  Substance Use Topics  Alcohol use: Yes    Comment: occasionally   Drug use: Not Currently     Allergies   Penicillins   Review of Systems Review of Systems Per HPI  Physical Exam Triage Vital Signs ED Triage Vitals [12/13/22 1500]  Enc Vitals Group     BP 127/83     Pulse Rate 79     Resp 16     Temp 98 F (36.7 C)     Temp Source Oral     SpO2 95 %     Weight      Height      Head Circumference      Peak Flow      Pain Score 6     Pain Loc      Pain Edu?      Excl. in [place]?    No data found.  Updated Vital Signs BP 127/83 (BP Location: Left Arm)   Pulse 79   Temp 98 F (36.7 C) (Oral)   Resp 16   SpO2 95%   Visual Acuity Right Eye Distance:   Left Eye Distance:   Bilateral Distance:    Right Eye Near:   Left Eye Near:    Bilateral Near:     Physical Exam Constitutional:      General: She is not in acute distress.    Appearance: Normal appearance. She is not toxic-appearing or  diaphoretic.  HENT:     Head: Normocephalic and atraumatic.     Right Ear: Tympanic membrane and ear canal normal.     Left Ear: Tympanic membrane and ear canal normal.     Nose: Congestion present.     Mouth/Throat:     Mouth: Mucous membranes are moist.     Pharynx: No posterior oropharyngeal erythema.  Eyes:     Extraocular Movements: Extraocular movements intact.     Conjunctiva/sclera: Conjunctivae normal.     Pupils: Pupils are equal, round, and reactive to light.  Cardiovascular:     Rate and Rhythm: Normal rate and regular rhythm.     Pulses: Normal pulses.     Heart sounds: Normal heart sounds.  Pulmonary:     Effort: Pulmonary effort is normal. No respiratory distress.     Breath sounds: No stridor. Wheezing present. No rhonchi or rales.     Comments: Wheezing noted bilaterally. Abdominal:     General: Abdomen is flat. Bowel sounds are normal. There is no distension.     Palpations: Abdomen is soft.     Tenderness: There is no abdominal tenderness.  Musculoskeletal:        General: Normal range of motion.     Cervical back: Normal range of motion.  Skin:    General: Skin is warm and dry.  Neurological:     General: No focal deficit present.     Mental Status: She is alert and oriented to person, place, and time. Mental status is at baseline.  Psychiatric:        Mood and Affect: Mood normal.        Behavior: Behavior normal.      UC Treatments / Results  Labs (all labs ordered are listed, but only abnormal results are displayed) Labs Reviewed  BASIC METABOLIC PANEL  CBC  POCT URINE PREGNANCY    EKG   Radiology DG Chest 2 View  Result Date: 12/13/2022 CLINICAL DATA:  Cough EXAM: CHEST - 2 VIEW COMPARISON:  Radiograph 03/12/2022 FINDINGS: Unchanged cardiomediastinal silhouette. There is no focal  airspace consolidation. There is no pleural effusion or evidence of pneumothorax. No acute osseous abnormality. IMPRESSION: No evidence of acute cardiopulmonary  disease. Electronically Signed   By: Maurine Simmering M.D.   On: 12/13/2022 16:10    Procedures Procedures (including critical care time)  Medications Ordered in UC Medications  albuterol (PROVENTIL) (2.5 MG/3ML) 0.083% nebulizer solution 2.5 mg (2.5 mg Nebulization Given 12/13/22 1533)    Initial Impression / Assessment and Plan / UC Course  I have reviewed the triage vital signs and the nursing notes.  Pertinent labs & imaging results that were available during my care of the patient were reviewed by me and considered in my medical decision making (see chart for details).     Patient here for persistent influenza symptoms.  Wheezing noted on auscultation.  Albuterol nebulizer treatment administered with improvement in lung sounds and patient stating that she felt better.  Chest x-ray completed that showed no acute cardiopulmonary process.  Patient reported that she had an albuterol inhaler at home so she was encouraged to use this consistently for the next 24 hours as prescribed and then as needed.  There is no tachypnea and oxygen is normal so do not think that emergent evaluation is necessary.  Patient reporting nausea, vomiting, diarrhea since symptoms started and after taking Tamiflu.  She has stopped taking Tamiflu at this time.  Will prescribe ondansetron to take as needed for nausea and advised patient to ensure a bland diet and adequate fluid hydration.  Patient does take Zoloft but I do think benefits outweigh risks to prevent dehydration with combination therapy of Zoloft and Zofran.  There are no signs of acute abdomen or dehydration on exam at this time.  Patient does report "blood clot" in her vomit but I suspect this was due to inflammation in the chest as opposed to GI bleeding.  Although, will obtain CBC and BMP to ensure no other worrisome etiology.  Discussed supportive cares and symptom management with patient.  Discussed strict return and ER precautions.  Patient verbalized  understanding and was agreeable with plan.  Pregnancy test completed prior to x-ray as patient was not sure if she had concern for pregnancy. It was negative.  Final Clinical Impressions(s) / UC Diagnoses   Final diagnoses:  Influenza  Nausea vomiting and diarrhea  Wheezing     Discharge Instructions      Your chest x-ray was normal.  Recommend that you use your albuterol inhaler consistently for the next 24 hours and then as needed.  I have prescribed you a nausea medication to take as needed.  Ensure that you are getting adequate fluid hydration.  Eat a bland diet and increase diet as tolerated.  Follow-up if any symptoms persist or worsen.     ED Prescriptions     Medication Sig Dispense Auth. Provider   ondansetron (ZOFRAN-ODT) 4 MG disintegrating tablet Take 1 tablet (4 mg total) by mouth every 8 (eight) hours as needed for nausea or vomiting. 20 tablet [place], Michele Rockers, [place]      PDMP not reviewed this encounter.   Teodora Medici, [place] 12/13/22 [place], [place], [place] 12/13/22 [phone]

## 2022-12-13 NOTE — Discharge Instructions (Signed)
Your chest x-ray was normal.  Recommend that you use your albuterol inhaler consistently for the next 24 hours and then as needed.  I have prescribed you a nausea medication to take as needed.  Ensure that you are getting adequate fluid hydration.  Eat a bland diet and increase diet as tolerated.  Follow-up if any symptoms persist or worsen.

## 2022-12-14 LAB — CBC
Hematocrit: 39.3 % (ref 34.0–46.6)
Hemoglobin: 13.2 g/dL (ref 11.1–15.9)
MCH: 31 pg (ref 26.6–33.0)
MCHC: 33.6 g/dL (ref 31.5–35.7)
MCV: 92 fL (ref 79–97)
Platelets: 259 10*3/uL (ref 150–450)
RBC: 4.26 x10E6/uL (ref 3.77–5.28)
RDW: 12.2 % (ref 11.7–15.4)
WBC: 10.3 10*3/uL (ref 3.4–10.8)

## 2022-12-14 LAB — BASIC METABOLIC PANEL
BUN/Creatinine Ratio: 10 (ref 9–23)
BUN: 7 mg/dL (ref 6–20)
CO2: 24 mmol/L (ref 20–29)
Calcium: 8.8 mg/dL (ref 8.7–10.2)
Chloride: 106 mmol/L (ref 96–106)
Creatinine, Ser: 0.71 mg/dL (ref 0.57–1.00)
Glucose: 87 mg/dL (ref 70–99)
Potassium: 4 mmol/L (ref 3.5–5.2)
Sodium: 143 mmol/L (ref 134–144)
eGFR: 114 mL/min/{1.73_m2} (ref >59)

## 2023-05-18 ENCOUNTER — Encounter (HOSPITAL_COMMUNITY): Payer: Self-pay | Admitting: *Deleted

## 2023-05-18 ENCOUNTER — Ambulatory Visit (HOSPITAL_COMMUNITY)
Admission: EM | Admit: 2023-05-18 | Discharge: 2023-05-18 | Disposition: A | Discharge status: Home / Self Care | Payer: Commercial Managed Care - PPO | Attending: Internal Medicine | Admitting: Internal Medicine

## 2023-05-18 ENCOUNTER — Ambulatory Visit (INDEPENDENT_AMBULATORY_CARE_PROVIDER_SITE_OTHER): Payer: Commercial Managed Care - PPO

## 2023-05-18 ENCOUNTER — Other Ambulatory Visit: Payer: Self-pay

## 2023-05-18 DIAGNOSIS — S93401A Sprain of unspecified ligament of right ankle, initial encounter: Secondary | ICD-10-CM

## 2023-05-18 NOTE — ED Provider Notes (Signed)
[facility]    CSN: [id number] Arrival date & time: 05/18/23  1240      History   Chief Complaint Chief Complaint  Patient presents with   Ankle Pain    HPI Shannon Carlson is a 37 y.o. female.   Patient presents to urgent care for evaluation of right ankle pain after she accidentally planted and twisted the right ankle a couple of days ago.  She has been ambulating on the right ankle after injury despite pain.  She is currently experiencing significant pain to the posterior right ankle that sometimes wraps around to the anterior generalized right ankle.  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  She reports chronic pain to the right ankle but has not been seen by an orthopedic provider in a couple of years for this.  No numbness or tingling distally to injury.  She has not taken any over-the-counter medications to help with pain or swelling before coming to urgent care.   Ankle Pain   Past Medical History:  Diagnosis Date   Anxiety    Depression    Vaginal Pap smear, abnormal     There are no problems to display for this patient.   History reviewed. No pertinent surgical history.  OB History     Gravida  1   Para      Term      Preterm      AB  1   Living         SAB  1   IAB      Ectopic      Multiple      Live Births               Home Medications    Prior to Admission medications   Medication Sig Start Date End Date Taking? Authorizing Provider  albuterol (VENTOLIN HFA) 108 (90 Base) MCG/ACT inhaler Inhale 1-2 puffs into the lungs every 6 (six) hours as needed for wheezing or shortness of breath. 10/18/21  Yes [place], Maralyn Sago M, PA-C  sertraline (ZOLOFT) 50 MG tablet Take 1 tablet (50 mg total) by mouth daily. 09/14/20  Yes Constant, Peggy, MD  ibuprofen (ADVIL) 800 MG tablet Take 1 tablet (800 mg total) by  mouth every 8 (eight) hours as needed (pain). 06/24/22   Zenia Resides, MD  ondansetron (ZOFRAN-ODT) 4 MG disintegrating tablet Take 1 tablet (4 mg total) by mouth every 8 (eight) hours as needed for nausea or vomiting. 12/13/22   Gustavus Bryant, FNP  oseltamivir (TAMIFLU) 75 MG capsule Take 1 capsule (75 mg total) by mouth every 12 (twelve) hours. 12/05/22   Eustace Moore, MD  Prenatal Vit-Fe Fumarate-FA (PRENATAL PO) Take by mouth.    [provider]    Family History Family History  Problem Relation Age of Onset   Asthma Mother    Hypertension Father    Heart attack Father    Hypertension Paternal Grandmother    Cancer Paternal Grandmother    Hypertension Paternal Grandfather    Hypertension Paternal Uncle     Social History Social History   Tobacco Use   Smoking status: Former    Packs/day: 1.00    Years: 14.00    Additional pack years: 0.00    Total pack years: 14.00    Types: Cigarettes   Smokeless tobacco: Never  Vaping  Use   Vaping Use: Never used  Substance Use Topics   Alcohol use: Yes    Comment: occasionally   Drug use: Not Currently     Allergies   Penicillins   Review of Systems Review of Systems   Physical Exam Triage Vital Signs ED Triage Vitals  Enc Vitals Group     BP 05/18/23 1332 (!) 146/97     Pulse Rate 05/18/23 1332 79     Resp 05/18/23 1332 16     Temp 05/18/23 1332 98.3 F (36.8 C)     Temp src --      SpO2 05/18/23 1332 95 %     Weight --      Height --      Head Circumference --      Peak Flow --      Pain Score 05/18/23 1329 8     Pain Loc --      Pain Edu? --      Excl. in [place]? --    No data found.  Updated Vital Signs BP (!) 146/97   Pulse 79   Temp 98.3 F (36.8 C)   Resp 16   LMP 05/10/2023   SpO2 95%   Visual Acuity Right Eye Distance:   Left Eye Distance:   Bilateral Distance:    Right Eye Near:   Left Eye Near:    Bilateral Near:     Physical Exam Vitals and nursing note reviewed.   Constitutional:      Appearance: She is not ill-appearing or toxic-appearing.  HENT:     Head: Normocephalic and atraumatic.     Right Ear: Hearing and external ear normal.     Left Ear: Hearing and external ear normal.     Nose: Nose normal.     Mouth/Throat:     Lips: Pink.  Eyes:     General: Lids are normal. Vision grossly intact. Gaze aligned appropriately.     Extraocular Movements: Extraocular movements intact.     Conjunctiva/sclera: Conjunctivae normal.  Pulmonary:     Effort: Pulmonary effort is normal.  Musculoskeletal:     Cervical back: Neck supple.  Skin:    General: Skin is warm and dry.     Capillary Refill: Capillary refill takes less than 2 seconds.     Findings: No rash.  Neurological:     General: No focal deficit present.     Mental Status: She is alert and oriented to person, place, and time. Mental status is at baseline.     Cranial Nerves: No cranial nerve deficit, dysarthria or facial asymmetry.     Motor: No weakness.     Gait: Gait normal.     Comments: Strength and sensation intact to bilateral lower extremities. +2 DP pulses bilaterally.  Psychiatric:        Mood and Affect: Mood normal.        Speech: Speech normal.        Behavior: Behavior normal.        Thought Content: Thought content normal.        Judgment: Judgment normal.      UC Treatments / Results  Labs (all labs ordered are listed, but only abnormal results are displayed) Labs Reviewed - No data to display  EKG   Radiology DG Ankle Complete Right  Result Date: 05/18/2023 CLINICAL DATA:  Right ankle sprain 1 year ago with repeat injury a few days ago. EXAM: RIGHT ANKLE - COMPLETE 3+ VIEW  COMPARISON:  Ankle radiographs 04/14/2021 FINDINGS: There is no acute fracture or dislocation. Bony alignment is normal. The joint spaces are preserved. The ankle mortise is intact. There is mild Achilles enthesopathy, unchanged. The soft tissues are unremarkable. IMPRESSION: No evidence of  acute injury in the ankle. Electronically Signed   By: Lesia Hausen M.D.   On: 05/18/2023 14:00    Procedures Procedures (including critical care time)  Medications Ordered in UC Medications - No data to display  Initial Impression / Assessment and Plan / UC Course  I have reviewed the triage vital signs and the nursing notes.  Pertinent labs & imaging results that were available during my care of the patient were reviewed by me and considered in my medical decision making (see chart for details).   1.  Sprain of right ankle Imaging is negative for acute fracture or dislocation.  Imaging does show mild Achilles and thus the left we will manage this with conservative treatment as an acute sprain to the ankle. RICE advised.  Patient has ankle brace at home and would like to wear this for compression and stability.  Ibuprofen 800 mg every 8 hours as needed for pain and inflammation at home.  Walking referral to orthopedics given should symptoms fail to improve in the next few weeks with conservative care.   Discussed physical exam and available lab work findings in clinic with patient.  Counseled patient regarding appropriate use of medications and potential side effects for all medications recommended or prescribed today. Discussed red flag signs and symptoms of worsening condition,when to call the PCP office, return to urgent care, and when to seek higher level of care in the emergency department. Patient verbalizes understanding and agreement with plan. All questions answered. Patient discharged in stable condition.    Final Clinical Impressions(s) / UC Diagnoses   Final diagnoses:  Sprain of right ankle, unspecified ligament, initial encounter     Discharge Instructions      You have sprained your right ankle.  Please wear the ankle brace that you have at home for compression and stability.  Take 800 mg of ibuprofen every 8 hours as needed for ankle pain and swelling.  Take this  medicine with food to avoid stomach upset.  Schedule an appointment with [facility] listed on your paperwork for ongoing evaluation and management of your chronic ankle pain and this acute ankle pain.  Return to urgent care as needed.  I hope you feel better!     ED Prescriptions   None    PDMP not reviewed this encounter.   Carlisle Beers, [place] 05/18/23 1415

## 2023-05-18 NOTE — ED Triage Notes (Signed)
Pt reports Rt ankle injury with sprain 1 year ago , Pt injured the same ankle a couple of days ago. Pt tripped and twisted RT ankle again .

## 2023-05-18 NOTE — Discharge Instructions (Signed)
You have sprained your right ankle.  Please wear the ankle brace that you have at home for compression and stability.  Take 800 mg of ibuprofen every 8 hours as needed for ankle pain and swelling.  Take this medicine with food to avoid stomach upset.  Schedule an appointment with [facility] listed on your paperwork for ongoing evaluation and management of your chronic ankle pain and this acute ankle pain.  Return to urgent care as needed.  I hope you feel better!

## 2023-05-21 ENCOUNTER — Encounter (INDEPENDENT_AMBULATORY_CARE_PROVIDER_SITE_OTHER): Payer: Self-pay | Admitting: Primary Care

## 2023-05-21 ENCOUNTER — Ambulatory Visit (INDEPENDENT_AMBULATORY_CARE_PROVIDER_SITE_OTHER): Payer: Commercial Managed Care - PPO | Admitting: Primary Care

## 2023-05-21 VITALS — BP 124/81 | HR 86 | Resp 16 | Ht 67.0 in | Wt 251.4 lb

## 2023-05-21 DIAGNOSIS — Z1159 Encounter for screening for other viral diseases: Secondary | ICD-10-CM

## 2023-05-21 DIAGNOSIS — Z823 Family history of stroke: Secondary | ICD-10-CM

## 2023-05-21 DIAGNOSIS — I10 Essential (primary) hypertension: Secondary | ICD-10-CM | POA: Diagnosis not present

## 2023-05-21 DIAGNOSIS — E1165 Type 2 diabetes mellitus with hyperglycemia: Secondary | ICD-10-CM | POA: Diagnosis not present

## 2023-05-21 DIAGNOSIS — Z7689 Persons encountering health services in other specified circumstances: Secondary | ICD-10-CM

## 2023-05-21 DIAGNOSIS — Z833 Family history of diabetes mellitus: Secondary | ICD-10-CM

## 2023-05-21 DIAGNOSIS — S93401A Sprain of unspecified ligament of right ankle, initial encounter: Secondary | ICD-10-CM

## 2023-05-21 DIAGNOSIS — Z7984 Long term (current) use of oral hypoglycemic drugs: Secondary | ICD-10-CM

## 2023-05-21 DIAGNOSIS — Z114 Encounter for screening for human immunodeficiency virus [HIV]: Secondary | ICD-10-CM

## 2023-05-21 DIAGNOSIS — N920 Excessive and frequent menstruation with regular cycle: Secondary | ICD-10-CM

## 2023-05-21 NOTE — Progress Notes (Signed)
New Patient Office Visit  Subjective    Patient ID: Shannon Carlson, female    DOB: Feb 12, 1986  Age: 37 y.o. MRN: [id number]  CC:  Chief Complaint  Patient presents with   New Patient (Initial Visit)   Hospitalization Follow-up    Urgent care  Ankle pain    Knee Pain    Right     HPI Shannon Carlson presents to establish care  Outpatient Encounter Medications as of 05/21/2023  Medication Sig   albuterol (VENTOLIN HFA) 108 (90 Base) MCG/ACT inhaler Inhale 1-2 puffs into the lungs every 6 (six) hours as needed for wheezing or shortness of breath.   sertraline (ZOLOFT) 50 MG tablet Take 1 tablet (50 mg total) by mouth daily.   ibuprofen (ADVIL) 800 MG tablet Take 1 tablet (800 mg total) by mouth every 8 (eight) hours as needed (pain). (Patient not taking: Reported on 05/21/2023)   ondansetron (ZOFRAN-ODT) 4 MG disintegrating tablet Take 1 tablet (4 mg total) by mouth every 8 (eight) hours as needed for nausea or vomiting. (Patient not taking: Reported on 05/21/2023)   oseltamivir (TAMIFLU) 75 MG capsule Take 1 capsule (75 mg total) by mouth every 12 (twelve) hours. (Patient not taking: Reported on 05/21/2023)   Prenatal Vit-Fe Fumarate-FA (PRENATAL PO) Take by mouth. (Patient not taking: Reported on 05/21/2023)   No facility-administered encounter medications on file as of 05/21/2023.    Past Medical History:  Diagnosis Date   Anxiety    Depression    Vaginal Pap smear, abnormal     No past surgical history on file.  Family History  Problem Relation Age of Onset   Asthma Mother    Hypertension Father    Heart attack Father    Hypertension Paternal Grandmother    Cancer Paternal Grandmother    Hypertension Paternal Grandfather    Hypertension Paternal Uncle     Social History   Socioeconomic History   Marital status: Married    Spouse name: Not on file   Number of children: Not on file   Years of education: Not on file   Highest education  level: Not on file  Occupational History   Not on file  Tobacco Use   Smoking status: Former    Packs/day: 1.00    Years: 14.00    Additional pack years: 0.00    Total pack years: 14.00    Types: Cigarettes   Smokeless tobacco: Never  Vaping Use   Vaping Use: Never used  Substance and Sexual Activity   Alcohol use: Yes    Comment: occasionally   Drug use: Not Currently   Sexual activity: Not on file  Other Topics Concern   Not on file  Social History Narrative   Not on file   Social Determinants of Health   Financial Resource Strain: Not on file  Food Insecurity: Not on file  Transportation Needs: Not on file  Physical Activity: Not on file  Stress: Not on file  Social Connections: Not on file  Intimate Partner Violence: Not on file    ROS Comprehensive ROS Pertinent positive and negative noted in HPI       Objective    Blood Pressure 124/81   Pulse 86   Respiration 16   Height 5\' 7"  (1.702 m)   Weight 251 lb 6.4 oz (114 kg)   Last Menstrual Period 05/10/2023   Oxygen Saturation 98%   Body Mass Index 39.37 kg/m   Physical Exam General: No apparent  distress. Morbid obese. Eyes: Extraocular eye movements intact, pupils equal and round. Neck: Supple, trachea midline. Thyroid: No enlargement, mobile without fixation, no tenderness. Cardiovascular: Regular rhythm and rate, no murmur, normal radial pulses. Respiratory: Normal respiratory effort, clear to auscultation. Gastrointestinal: Normal pitch active bowel sounds, nontender abdomen without distention or appreciable hepatomegaly. Musculoskeletal: Normal muscle tone, no tenderness on palpation of tibia, no excessive thoracic kyphosis. Skin: Appropriate warmth, no visible rash. Mental status: Alert, conversant, speech clear, thought logical, appropriate mood and affect, no hallucinations or delusions evident. Hematologic/lymphatic: No cervical adenopathy, no visible ecchymoses.     Assessment & Plan:   Shannon Carlson was seen today for new patient (initial visit), hospitalization follow-up and knee pain.  Diagnoses and all orders for this visit:  Encounter to establish care  Family history of diabetes mellitus (DM) -     Hemoglobin A1c  Family history of cerebrovascular accident (CVA) -     CMP14+EGFR -     Lipid panel  Encounter for HCV screening test for low risk patient -     HCV Ab w Reflex to Quant PCR  Encounter for screening for HIV -     HIV Antibody (routine testing w rflx)  Menorrhagia with regular cycle -     CBC with Differential/Platelet  Sprain and strain of right ankle -     Ambulatory referral to Orthopedic Surgery  Other orders -     Interpretation:    Return in about 6 weeks (around 07/02/2023) for pap.   [name], NP

## 2023-05-22 LAB — CMP14+EGFR
ALT: 16 IU/L (ref 0–32)
AST: 20 IU/L (ref 0–40)
Albumin/Globulin Ratio: 1.6
Albumin: 4.3 g/dL (ref 3.9–4.9)
Alkaline Phosphatase: 94 IU/L (ref 44–121)
BUN/Creatinine Ratio: 16 (ref 9–23)
BUN: 11 mg/dL (ref 6–20)
Bilirubin Total: 0.3 mg/dL (ref 0.0–1.2)
CO2: 23 mmol/L (ref 20–29)
Calcium: 9.1 mg/dL (ref 8.7–10.2)
Chloride: 103 mmol/L (ref 96–106)
Creatinine, Ser: 0.7 mg/dL (ref 0.57–1.00)
Globulin, Total: 2.7 g/dL (ref 1.5–4.5)
Glucose: 79 mg/dL (ref 70–99)
Potassium: 4.6 mmol/L (ref 3.5–5.2)
Sodium: 138 mmol/L (ref 134–144)
Total Protein: 7 g/dL (ref 6.0–8.5)
eGFR: 116 mL/min/{1.73_m2} (ref >59)

## 2023-05-22 LAB — CBC WITH DIFFERENTIAL/PLATELET
Basophils Absolute: 0.1 10*3/uL (ref 0.0–0.2)
Basos: 1 %
EOS (ABSOLUTE): 0.1 10*3/uL (ref 0.0–0.4)
Eos: 1 %
Hematocrit: 42.9 % (ref 34.0–46.6)
Hemoglobin: 13.9 g/dL (ref 11.1–15.9)
Immature Grans (Abs): 0 10*3/uL (ref 0.0–0.1)
Immature Granulocytes: 0 %
Lymphocytes Absolute: 2.5 10*3/uL (ref 0.7–3.1)
Lymphs: 31 %
MCH: 31 pg (ref 26.6–33.0)
MCHC: 32.4 g/dL (ref 31.5–35.7)
MCV: 96 fL (ref 79–97)
Monocytes Absolute: 0.3 10*3/uL (ref 0.1–0.9)
Monocytes: 4 %
Neutrophils Absolute: 5 10*3/uL (ref 1.4–7.0)
Neutrophils: 63 %
Platelets: 225 10*3/uL (ref 150–450)
RBC: 4.48 x10E6/uL (ref 3.77–5.28)
RDW: 12.8 % (ref 11.7–15.4)
WBC: 7.9 10*3/uL (ref 3.4–10.8)

## 2023-05-22 LAB — HIV ANTIBODY (ROUTINE TESTING W REFLEX): HIV Screen 4th Generation wRfx: NONREACTIVE

## 2023-05-22 LAB — LIPID PANEL
Chol/HDL Ratio: 3.1 ratio (ref 0.0–4.4)
Cholesterol, Total: 160 mg/dL (ref 100–199)
HDL: 52 mg/dL (ref >39)
LDL Chol Calc (NIH): 95 mg/dL (ref 0–99)
Triglycerides: 66 mg/dL (ref 0–149)
VLDL Cholesterol Cal: 13 mg/dL (ref 5–40)

## 2023-05-22 LAB — HEMOGLOBIN A1C
Est. average glucose Bld gHb Est-mCnc: 97 mg/dL
Hgb A1c MFr Bld: 5 % (ref 4.8–5.6)

## 2023-05-22 LAB — HCV AB W REFLEX TO QUANT PCR: HCV Ab: NONREACTIVE

## 2023-05-22 LAB — HCV INTERPRETATION

## 2023-05-28 ENCOUNTER — Ambulatory Visit (INDEPENDENT_AMBULATORY_CARE_PROVIDER_SITE_OTHER): Payer: Commercial Managed Care - PPO | Admitting: Orthopaedic Surgery

## 2023-05-28 DIAGNOSIS — M7661 Achilles tendinitis, right leg: Secondary | ICD-10-CM

## 2023-05-28 NOTE — Progress Notes (Signed)
Office Visit Note   Patient: Shannon Carlson           Date of Birth: 12/08/1986           MRN: [id number] Visit Date: 05/28/2023              Requested by: [name], NP [address] [place],  [place] [address] PCP: [name], NP   Assessment & Plan: Visit Diagnoses:  1. 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  She has not felt any improvement from conservative management.  At this point we will order an MRI of the right ankle to assess for structural abnormalities of the Achilles.  Follow-up after the MRI.  Follow-Up Instructions: No follow-ups on file.   Orders:  Orders Placed This Encounter  Procedures   MR Ankle Right w/o contrast   No orders of the defined types were placed in this encounter.     Procedures: No procedures performed   Clinical Data: No additional findings.   Subjective: Chief Complaint  Patient presents with   Right Ankle - Pain    HPI Shannon Carlson is a 37 year old female here for chronic posterior right ankle pain for 2 years.  She has seen a podiatrist in the past that recommended conservative treatments that she has been doing for the last 2 years.  Unfortunately the pain has not improved.  She has done physical therapy as well as wear cam boot for upwards of a month.  She works at a hotel in town and the pain is worse with standing and walking.  Review of Systems  Constitutional: Negative.   HENT: Negative.    Eyes: Negative.   Respiratory: Negative.    Cardiovascular: Negative.   Endocrine: Negative.   Musculoskeletal: Negative.   Neurological: Negative.   Hematological: Negative.   Psychiatric/Behavioral: Negative.    All other systems reviewed and are negative.    Objective: Vital Signs: LMP 05/10/2023   Physical Exam Vitals and nursing note reviewed.  Constitutional:      Appearance: She  is well-developed.  HENT:     Head: Atraumatic.     Nose: Nose normal.  Eyes:     Extraocular Movements: Extraocular movements intact.  Cardiovascular:     Pulses: Normal pulses.  Pulmonary:     Effort: Pulmonary effort is normal.  Abdominal:     Palpations: Abdomen is soft.  Musculoskeletal:     Cervical back: Neck supple.  Skin:    General: Skin is warm.     Capillary Refill: Capillary refill takes less than 2 seconds.  Neurological:     Mental Status: She is alert. Mental status is at baseline.  Psychiatric:        Behavior: Behavior normal.        Thought Content: Thought content normal.        Judgment: Judgment normal.     Ortho Exam Examination right ankle shows tenderness to the distal portion of the Achilles insertion.  All other portions of the exam are unremarkable. Specialty Comments:  No specialty comments available.  Imaging: No results found.   PMFS History: Patient Active Problem List   Diagnosis Date Noted   Insertional tendinopathy of right Achilles tendon 05/28/2023   Past Medical History:  Diagnosis Date   Anxiety    Depression    Vaginal Pap smear, abnormal     Family  History  Problem Relation Age of Onset   Asthma Mother    Hypertension Father    Heart attack Father    Hypertension Paternal Grandmother    Cancer Paternal Grandmother    Hypertension Paternal Grandfather    Hypertension Paternal Uncle     No past surgical history on file. Social History   Occupational History   Not on file  Tobacco Use   Smoking status: Former    Packs/day: 1.00    Years: 14.00    Additional pack years: 0.00    Total pack years: 14.00    Types: Cigarettes   Smokeless tobacco: Never  Vaping Use   Vaping Use: Never used  Substance and Sexual Activity   Alcohol use: Yes    Comment: occasionally   Drug use: Not Currently   Sexual activity: Not on file

## 2023-06-03 ENCOUNTER — Ambulatory Visit
Admission: RE | Admit: 2023-06-03 | Discharge: 2023-06-03 | Disposition: A | Discharge status: Home / Self Care | Payer: Commercial Managed Care - PPO | Source: Ambulatory Visit | Attending: Orthopaedic Surgery | Admitting: Orthopaedic Surgery

## 2023-06-03 DIAGNOSIS — M7661 Achilles tendinitis, right leg: Secondary | ICD-10-CM

## 2023-06-14 ENCOUNTER — Ambulatory Visit (INDEPENDENT_AMBULATORY_CARE_PROVIDER_SITE_OTHER): Payer: Commercial Managed Care - PPO | Admitting: Orthopaedic Surgery

## 2023-06-14 DIAGNOSIS — M25571 Pain in right ankle and joints of right foot: Secondary | ICD-10-CM

## 2023-06-14 DIAGNOSIS — G8929 Other chronic pain: Secondary | ICD-10-CM

## 2023-06-14 NOTE — Progress Notes (Signed)
   Office Visit Note   Patient: Shannon Carlson           Date of Birth: 22-Aug-1986           MRN: [id number] Visit Date: 06/14/2023              Requested by: [name], NP [address] [place],  [place] [address] PCP: [name], NP   Assessment & Plan: Visit Diagnoses:  1. Chronic pain of right ankle     Plan: Shannon Carlson is a 37 year old female with insertional Achilles tendinopathy.  MRI is relatively unremarkable and negative for structural abnormalities.  We discussed strategies for managing her symptoms including work restrictions, cam boot, heel lift and physical therapy.  She would like to try heel lift at this time.  She has done some physical therapy in the past.  From my standpoint she can follow-up with [place] as needed.  Total face to face encounter time was greater than 25 minutes and over half of this time was spent in counseling and/or coordination of care.  Follow-Up Instructions: Return if symptoms worsen or fail to improve.   Orders:  No orders of the defined types were placed in this encounter.  No orders of the defined types were placed in this encounter.     Procedures: No procedures performed   Clinical Data: No additional findings.   Subjective: Chief Complaint  Patient presents with   Right Ankle - Follow-up    HPI patient is a pleasant 37 year old female who comes in today to discuss MRI results of her right ankle.  Initial injury occurred about 2 years ago.  She sprained her ankle and has had chronic posterior medial pain since.  Symptoms have been worse with walking.  She takes ibuprofen without relief.  She denies any paresthesias to her right lower extremity.  Recent MRI of the right ankle is unremarkable.     Objective: Vital Signs: LMP 05/10/2023     Ortho Exam unchanged right ankle exam  Specialty Comments:  No specialty comments available.  Imaging: No new imaging   PMFS  History: Patient Active Problem List   Diagnosis Date Noted   Insertional tendinopathy of right Achilles tendon 05/28/2023   Past Medical History:  Diagnosis Date   Anxiety    Depression    Vaginal Pap smear, abnormal     Family History  Problem Relation Age of Onset   Asthma Mother    Hypertension Father    Heart attack Father    Hypertension Paternal Grandmother    Cancer Paternal Grandmother    Hypertension Paternal Grandfather    Hypertension Paternal Uncle     No past surgical history on file. Social History   Occupational History   Not on file  Tobacco Use   Smoking status: Former    Packs/day: 1.00    Years: 14.00    Additional pack years: 0.00    Total pack years: 14.00    Types: Cigarettes   Smokeless tobacco: Never  Vaping Use   Vaping Use: Never used  Substance and Sexual Activity   Alcohol use: Yes    Comment: occasionally   Drug use: Not Currently   Sexual activity: Not on file

## 2023-06-26 ENCOUNTER — Ambulatory Visit (INDEPENDENT_AMBULATORY_CARE_PROVIDER_SITE_OTHER): Payer: Commercial Managed Care - PPO

## 2023-06-26 DIAGNOSIS — R079 Chest pain, unspecified: Secondary | ICD-10-CM | POA: Diagnosis not present

## 2023-07-17 ENCOUNTER — Other Ambulatory Visit (HOSPITAL_COMMUNITY)
Admission: RE | Admit: 2023-07-17 | Discharge: 2023-07-17 | Disposition: A | Discharge status: Home / Self Care | Payer: Commercial Managed Care - PPO | Source: Ambulatory Visit | Attending: Primary Care | Admitting: Primary Care

## 2023-07-17 ENCOUNTER — Encounter (INDEPENDENT_AMBULATORY_CARE_PROVIDER_SITE_OTHER): Payer: Self-pay | Admitting: Primary Care

## 2023-07-17 ENCOUNTER — Ambulatory Visit (INDEPENDENT_AMBULATORY_CARE_PROVIDER_SITE_OTHER): Payer: Commercial Managed Care - PPO | Admitting: Primary Care

## 2023-07-17 VITALS — BP 122/83 | HR 89 | Resp 16 | Wt 252.4 lb

## 2023-07-17 DIAGNOSIS — Z124 Encounter for screening for malignant neoplasm of cervix: Secondary | ICD-10-CM | POA: Diagnosis not present

## 2023-07-17 NOTE — Progress Notes (Signed)
  [facility]  WELL-WOMAN PHYSICAL [facility] Patient name: Shannon Carlson MRN [id number]  Date of birth: 1986-09-18 Chief Complaint:   Gynecologic Exam  History of Present Illness:   Shannon Carlson is a 37 y.o. G28P0010 female being seen today for a routine well-woman exam.   CC:GYN   The current method of family planning is none.  No LMP recorded. Last pap 01/12/20. Results were: STD tx Family h/o breast cancer: No  Family h/o colorectal cancer: No  Review of Systems:    Denies any headaches, blurred vision, fatigue, shortness of breath, chest pain, abdominal pain, abnormal vaginal discharge/itching/odor/irritation, problems with periods, bowel movements, urination, or intercourse unless otherwise stated above.  Pertinent History Reviewed:   Reviewed past medical,surgical, social and family history.  Reviewed problem list, medications and allergies.  Physical Assessment:   Vitals:   07/17/23 1317  BP: 122/83  Pulse: 89  Resp: 16  SpO2: 96%  There is no height or weight on file to calculate BMI.        Physical Examination:  General appearance - well appearing, and in no distress Mental status - alert, oriented to person, place, and time Psych:  She has a normal mood and affect Skin - warm and dry, normal color, no suspicious lesions noted Chest - effort normal, all lung fields clear to auscultation bilaterally Heart - normal rate and regular rhythm Neck:  midline trachea, no thyromegaly or nodules Breasts - breasts appear normal, no suspicious masses, no skin or nipple changes or axillary nodes Educated patient on proper self breast examination and had patient to demonstrate SBE. Abdomen - soft, nontender, nondistended, no masses or organomegaly Pelvic-VULVA: normal appearing vulva with no masses, tenderness or lesions   VAGINA: normal appearing vagina with normal color and discharge, no lesions   CERVIX: normal appearing cervix  without discharge or lesions, no CMT UTERUS: uterus is felt to be normal size, shape, consistency and nontender  ADNEXA: No adnexal masses or tenderness noted. Extremities:  No swelling or varicosities noted  No results found for this or any previous visit (from the past 24 hour(s)).   Assessment & Plan:  Shannon Carlson was seen today for gynecologic exam.  Diagnoses and all orders for this visit:  Cervical cancer screening -     Cytology - PAP -     Cervicovaginal ancillary only  Follow-up: No follow-ups on file.  This note has been created with Education officer, environmental. Any transcriptional errors are unintentional.   [name], NP 07/17/2023, 1:18 PM

## 2023-07-18 ENCOUNTER — Ambulatory Visit (INDEPENDENT_AMBULATORY_CARE_PROVIDER_SITE_OTHER): Payer: Commercial Managed Care - PPO | Admitting: Primary Care

## 2023-07-24 ENCOUNTER — Ambulatory Visit (INDEPENDENT_AMBULATORY_CARE_PROVIDER_SITE_OTHER): Payer: Commercial Managed Care - PPO

## 2023-07-24 DIAGNOSIS — R0989 Other specified symptoms and signs involving the circulatory and respiratory systems: Secondary | ICD-10-CM

## 2023-07-26 LAB — COVID-19, FLU A+B AND RSV
Influenza A, NAA: NOT DETECTED
Influenza B, NAA: NOT DETECTED
RSV, NAA: NOT DETECTED
SARS-CoV-2, NAA: NOT DETECTED

## 2023-10-03 IMAGING — DX DG WRIST COMPLETE 3+V*R*
4 series · 4 of 4 positions shown · non-contrast
Comparison: None.

CLINICAL DATA: Right wrist and metacarpal pain.  Swelling.

EXAM:
RIGHT WRIST - COMPLETE 3+ VIEW

[wrist pa]
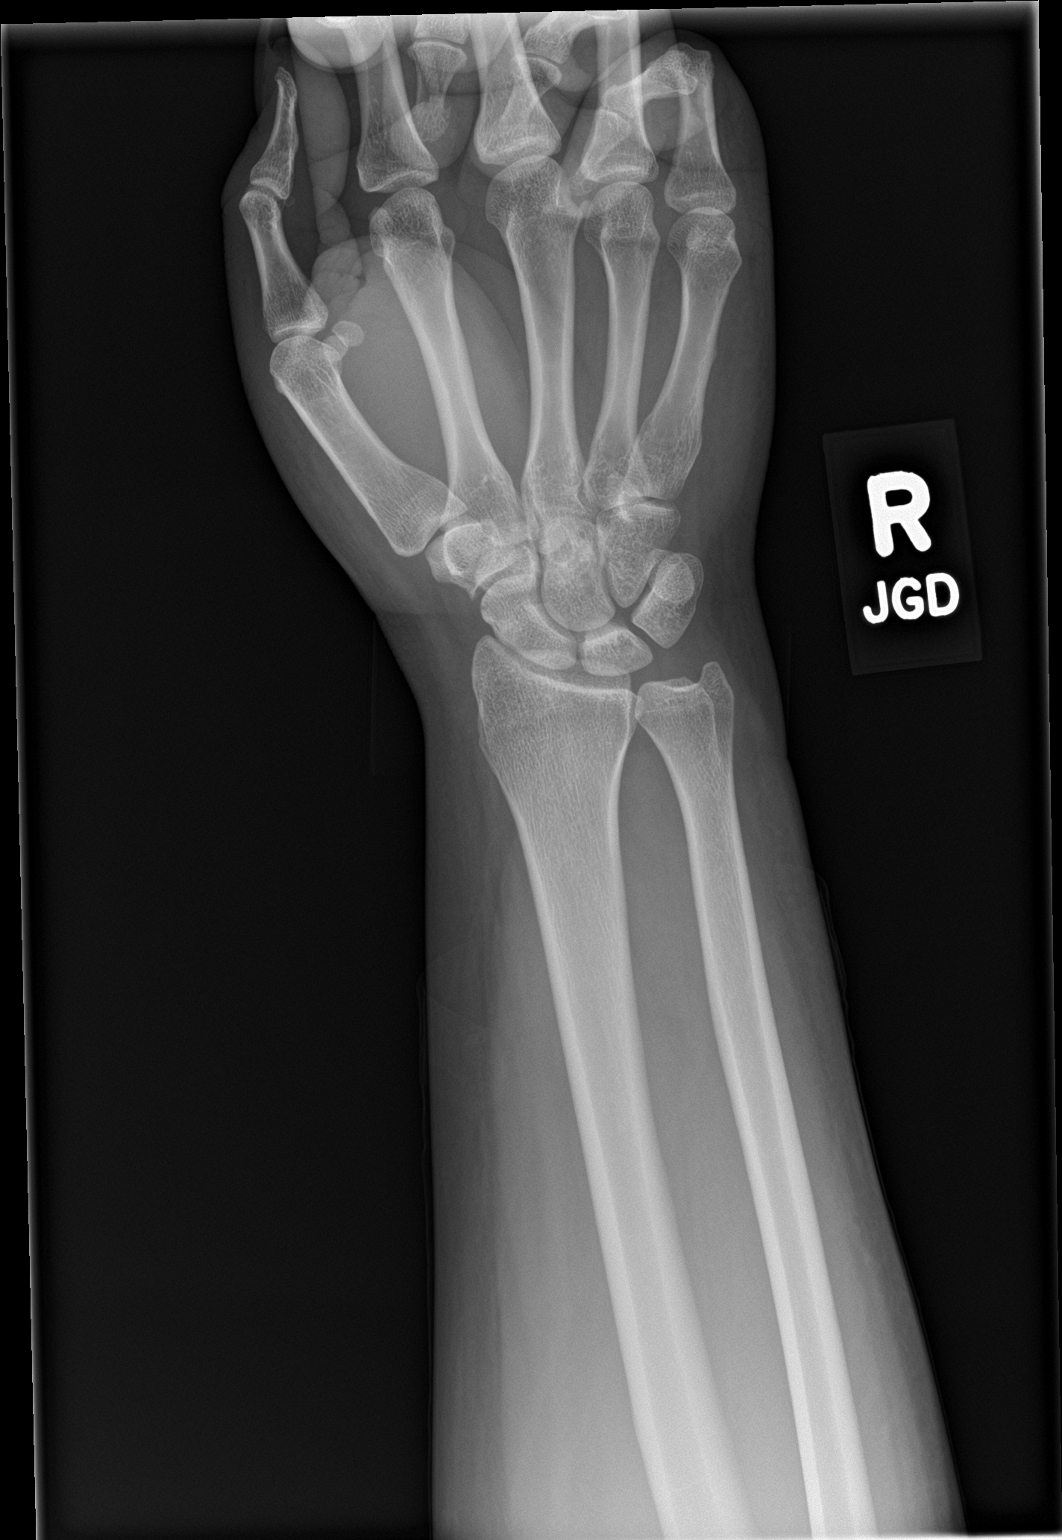

[wrist navicular]
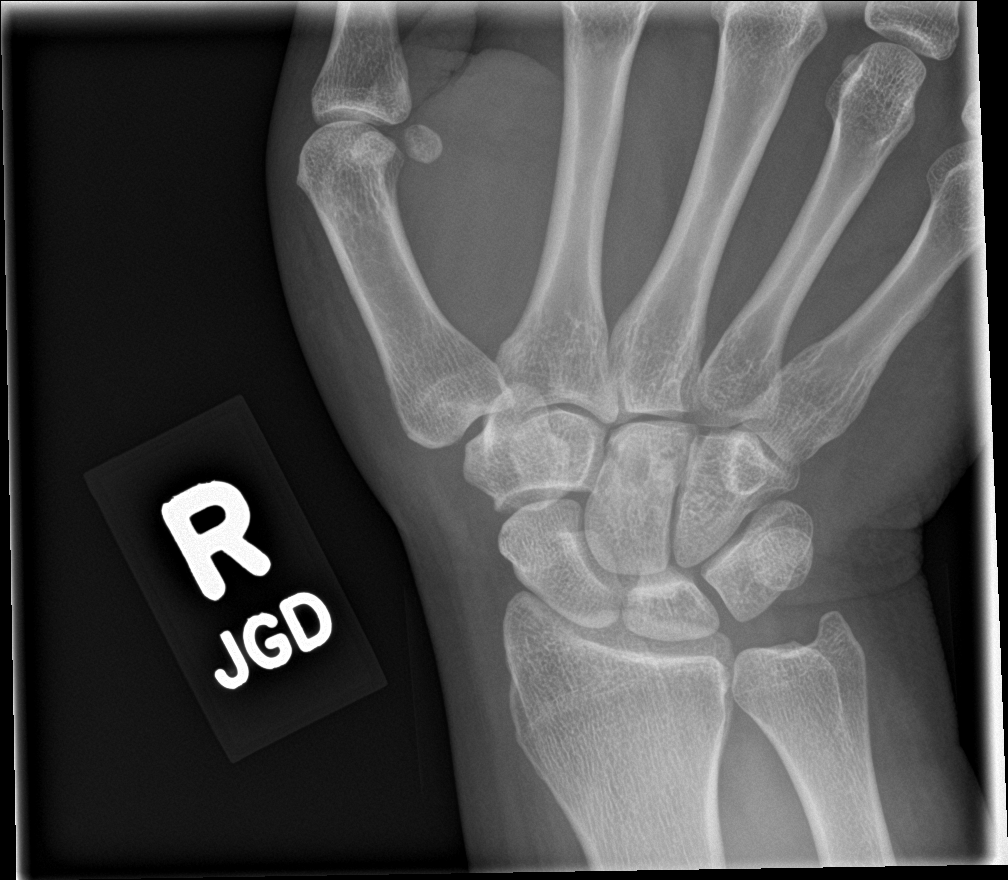

[wrist obl]
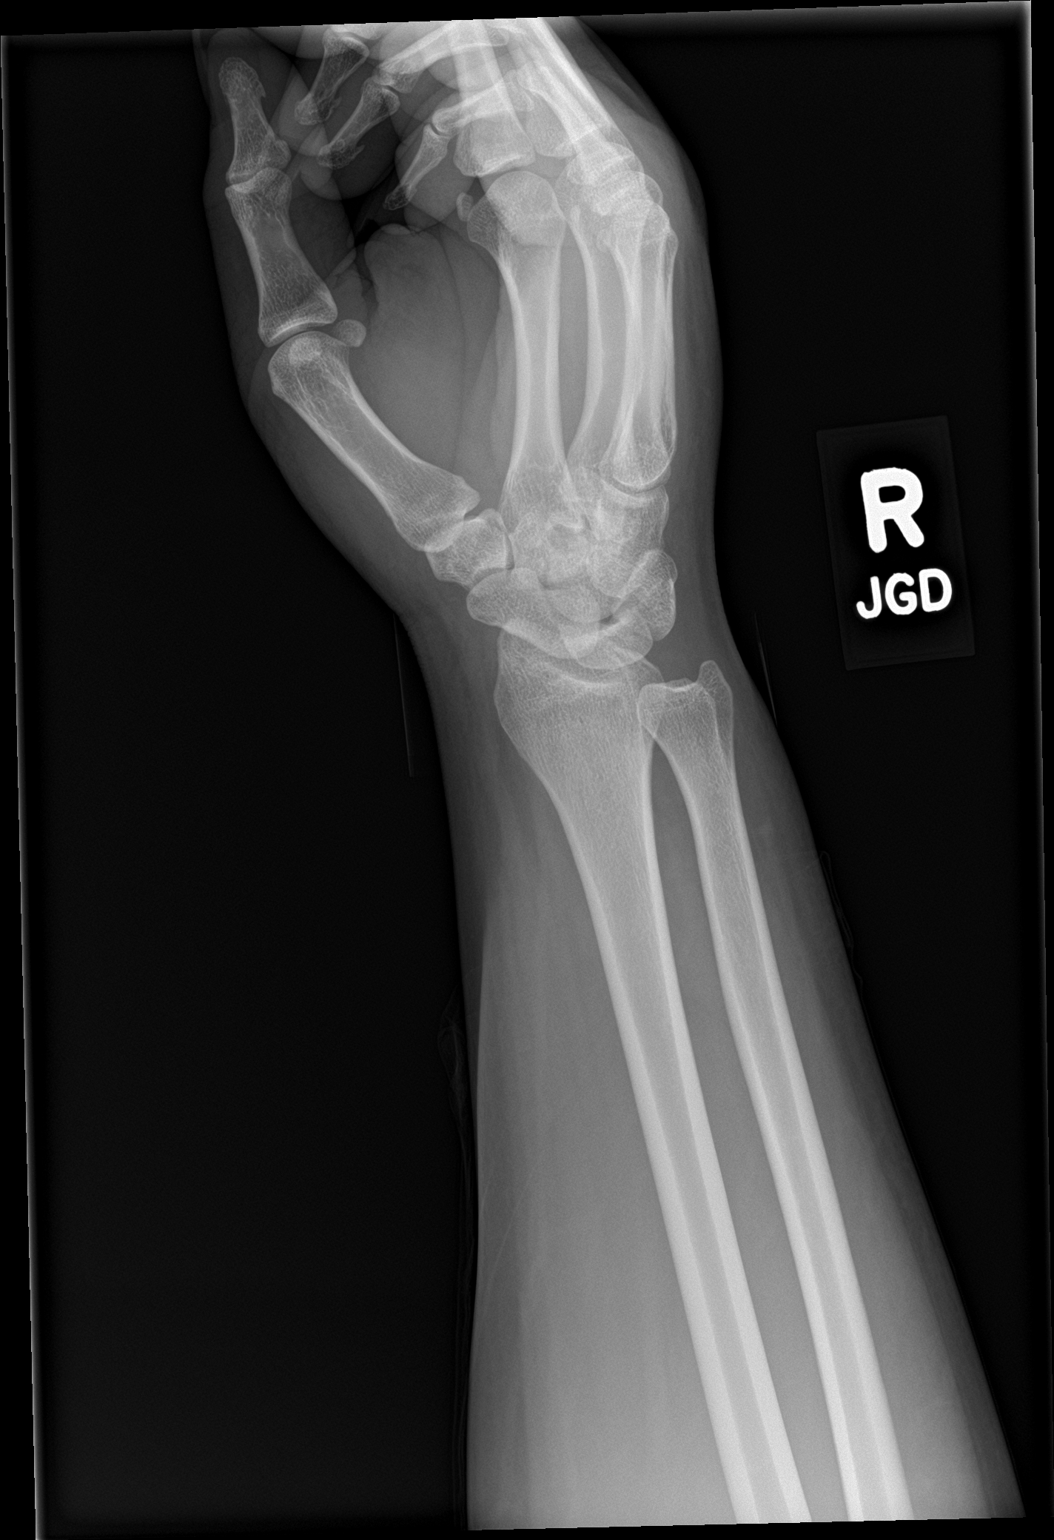

[wrist lat]
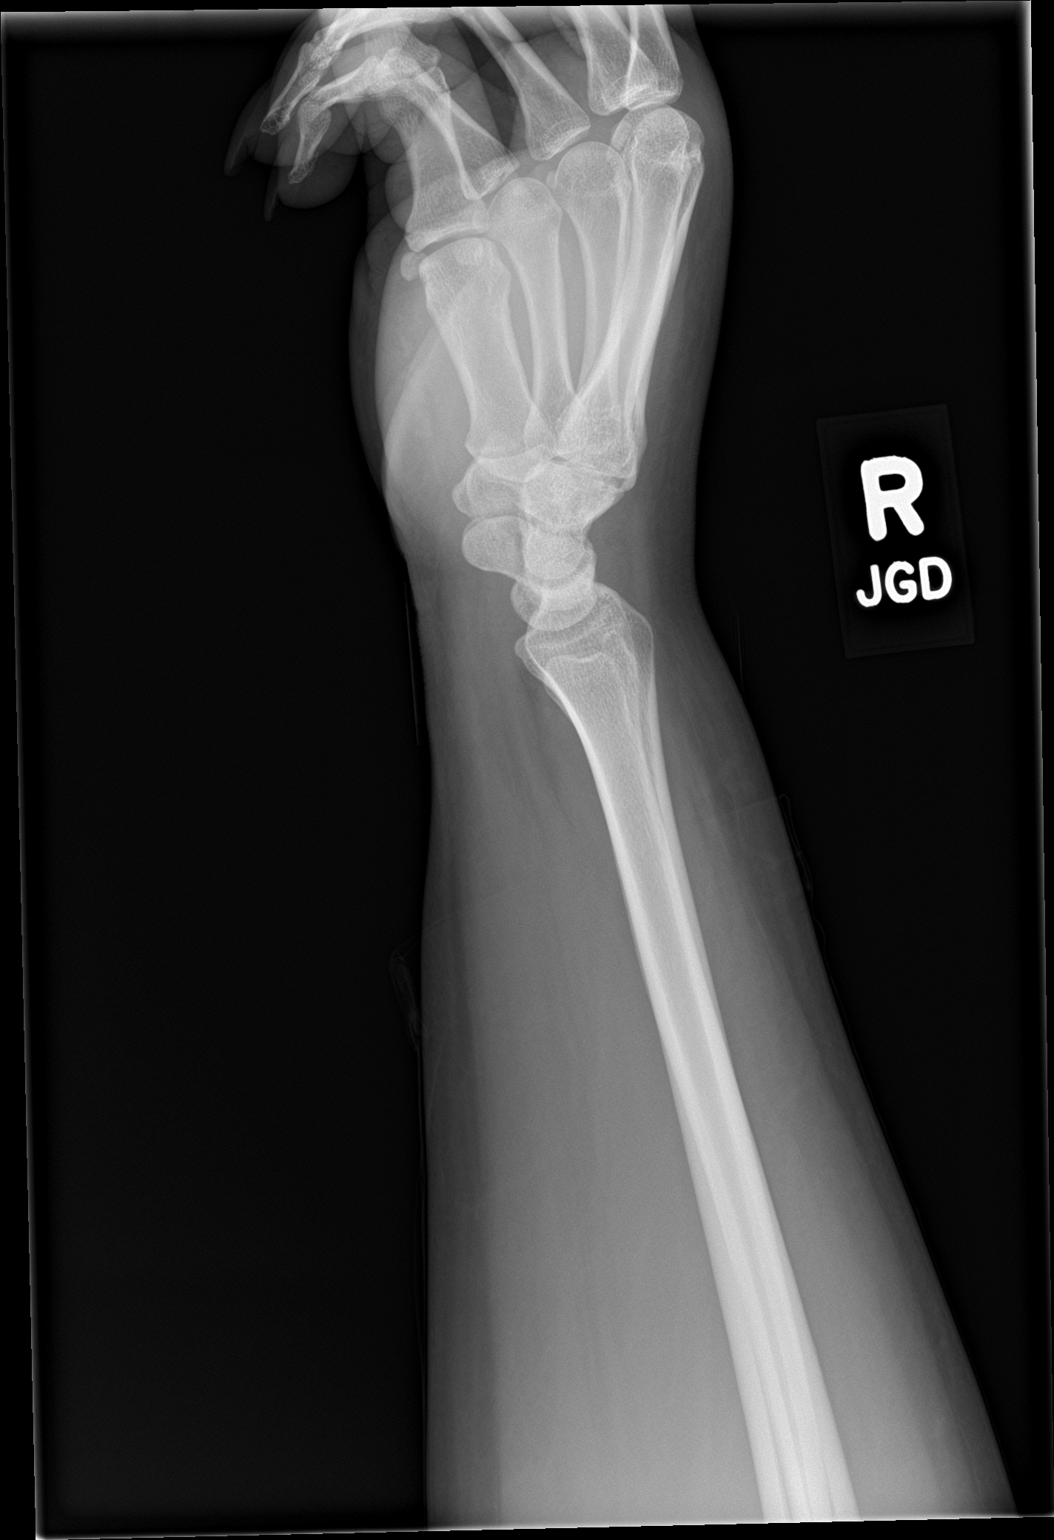

[4 of 4 positions shown; findings below may reference images not displayed]

FINDINGS: There is no evidence of fracture or dislocation. There is no
evidence of arthropathy or other focal bone abnormality. Soft
tissues are unremarkable.
IMPRESSION: Negative.

## 2023-10-22 ENCOUNTER — Ambulatory Visit (INDEPENDENT_AMBULATORY_CARE_PROVIDER_SITE_OTHER): Payer: Commercial Managed Care - PPO

## 2023-10-22 DIAGNOSIS — Z23 Encounter for immunization: Secondary | ICD-10-CM

## 2023-10-22 NOTE — Patient Instructions (Signed)

## 2023-10-23 DIAGNOSIS — Z23 Encounter for immunization: Secondary | ICD-10-CM | POA: Diagnosis not present

## 2023-11-20 ENCOUNTER — Ambulatory Visit (INDEPENDENT_AMBULATORY_CARE_PROVIDER_SITE_OTHER): Payer: Commercial Managed Care - PPO | Admitting: Primary Care

## 2023-11-20 ENCOUNTER — Encounter (INDEPENDENT_AMBULATORY_CARE_PROVIDER_SITE_OTHER): Payer: Self-pay | Admitting: Primary Care

## 2023-11-20 VITALS — BP 118/83

## 2023-11-20 DIAGNOSIS — R634 Abnormal weight loss: Secondary | ICD-10-CM

## 2023-11-20 DIAGNOSIS — Z0130 Encounter for examination of blood pressure without abnormal findings: Secondary | ICD-10-CM | POA: Diagnosis not present

## 2023-11-26 NOTE — Progress Notes (Signed)
[facility]  Shannon Carlson, is a 37 y.o. female  GEX:[id number]  WNU:[id number]  DOB - March 10, 1986  Bp check       Subjective:   Shannon Carlson is a 37 y.o. female here today for a follow up visit  BP check up. She voices no problems or concerns. Patient has No headache, No chest pain, No abdominal pain - No Nausea, No new weakness tingling or numbness, No Cough - shortness of breath   No problems updated.  Comprehensive ROS Pertinent positive and negative noted in HPI   Allergies  Allergen Reactions   Penicillins Hives    Past Medical History:  Diagnosis Date   Anxiety    Depression    Vaginal Pap smear, abnormal     Current Outpatient Medications on File Prior to Visit  Medication Sig Dispense Refill   albuterol (VENTOLIN HFA) 108 (90 Base) MCG/ACT inhaler Inhale 1-2 puffs into the lungs every 6 (six) hours as needed for wheezing or shortness of breath. 1 each 0   sertraline (ZOLOFT) 50 MG tablet Take 1 tablet (50 mg total) by mouth daily. 30 tablet 4   No current facility-administered medications on file prior to visit.   Health Maintenance  Topic Date Due   COVID-19 Vaccine (1 - 2024-25 season) Never done   Pap with HPV screening  07/16/2028   DTaP/Tdap/Td vaccine (2 - Td or Tdap) 01/11/2030   Flu Shot  Completed   Hepatitis C Screening  Completed   HIV Screening  Completed   HPV Vaccine  Aged Out    Objective:  BP 118/83   BP Readings from Last 3 Encounters:  07/17/23 122/83  05/21/23 124/81  05/18/23 (!) 146/97     Physical Exam Vitals reviewed.  HENT:     Head: Normocephalic.     Right Ear: Tympanic membrane, ear canal and external ear normal.     Left Ear: Tympanic membrane, ear canal and external ear normal.     Nose: Nose normal.     Mouth/Throat:     Mouth: Mucous membranes are moist.  Eyes:     Extraocular Movements: Extraocular movements intact.     Pupils: Pupils are equal, round, and reactive to light.   Cardiovascular:     Rate and Rhythm: Normal rate.  Pulmonary:     Effort: Pulmonary effort is normal.     Breath sounds: Normal breath sounds.  Abdominal:     General: Bowel sounds are normal.     Palpations: Abdomen is soft.  Musculoskeletal:        General: Normal range of motion.     Cervical back: Normal range of motion.  Skin:    General: Skin is warm and dry.  Neurological:     Mental Status: She is alert and oriented to person, place, and time.  Psychiatric:        Mood and Affect: Mood normal.        Behavior: Behavior normal.        Thought Content: Thought content normal.    Assessment & Plan    Patient have been counseled extensively about nutrition and exercise. Other issues discussed during this visit include: low cholesterol diet, weight control and daily exercise, foot care, annual eye examinations at Ophthalmology, importance of adherence with medications and regular follow-up. We also discussed long term complications of uncontrolled diabetes and hypertension.   No follow-ups on file.  The patient was given clear instructions to go to ER or return  to medical center if symptoms don't improve, worsen or new problems develop. The patient verbalized understanding. The patient was told to call to get lab results if they haven't heard anything in the next week.   This note has been created with Education officer, environmental. Any transcriptional errors are unintentional.   [name], NP 11/26/2023, 1:40 PM

## 2023-12-29 IMAGING — DX DG CHEST 2V
2 series · 2 of 2 positions shown · non-contrast
Comparison: Chest x-ray 10/18/2021.

CLINICAL DATA: COVID, wheezing.

EXAM:
CHEST - 2 VIEW

[chest pa]
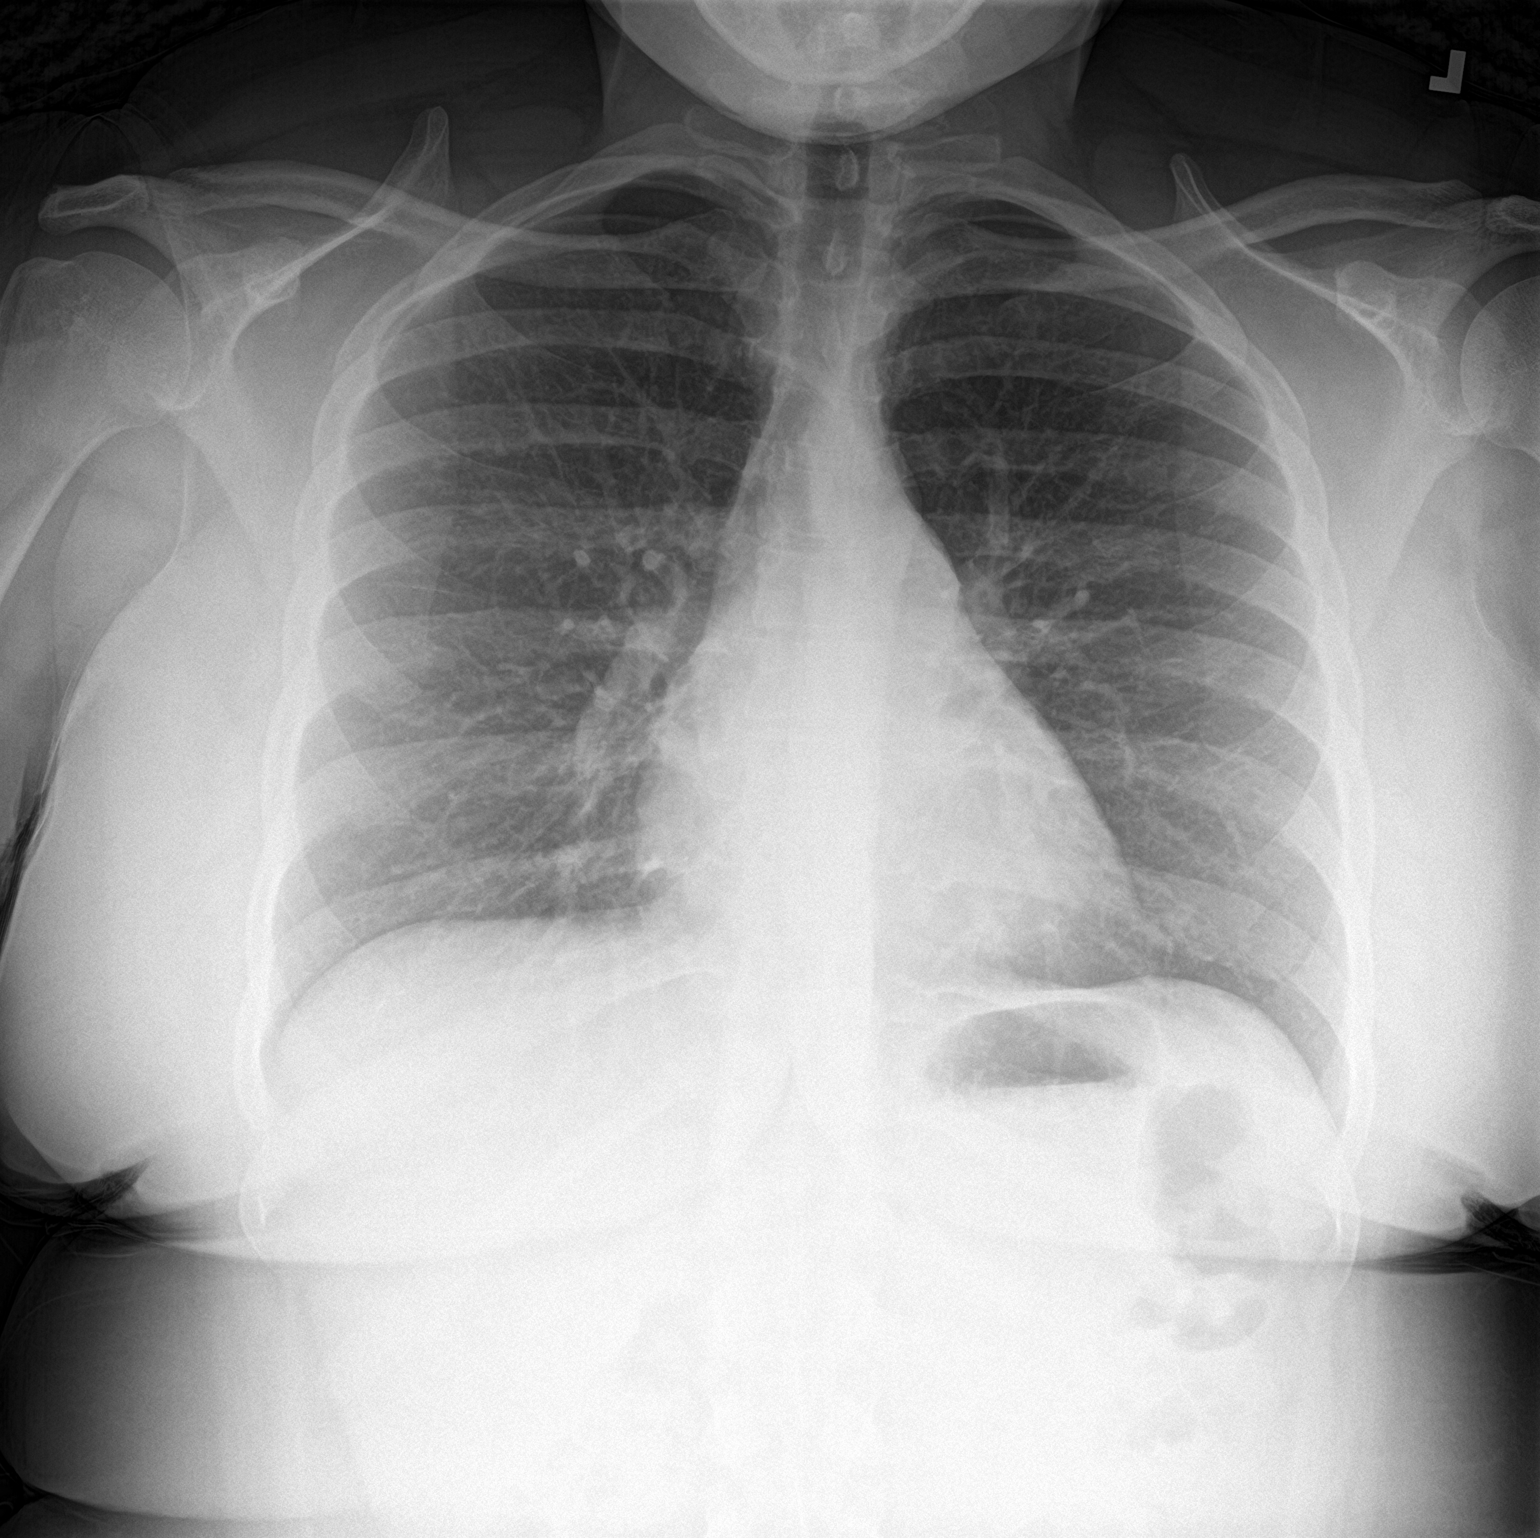

[chest lat]
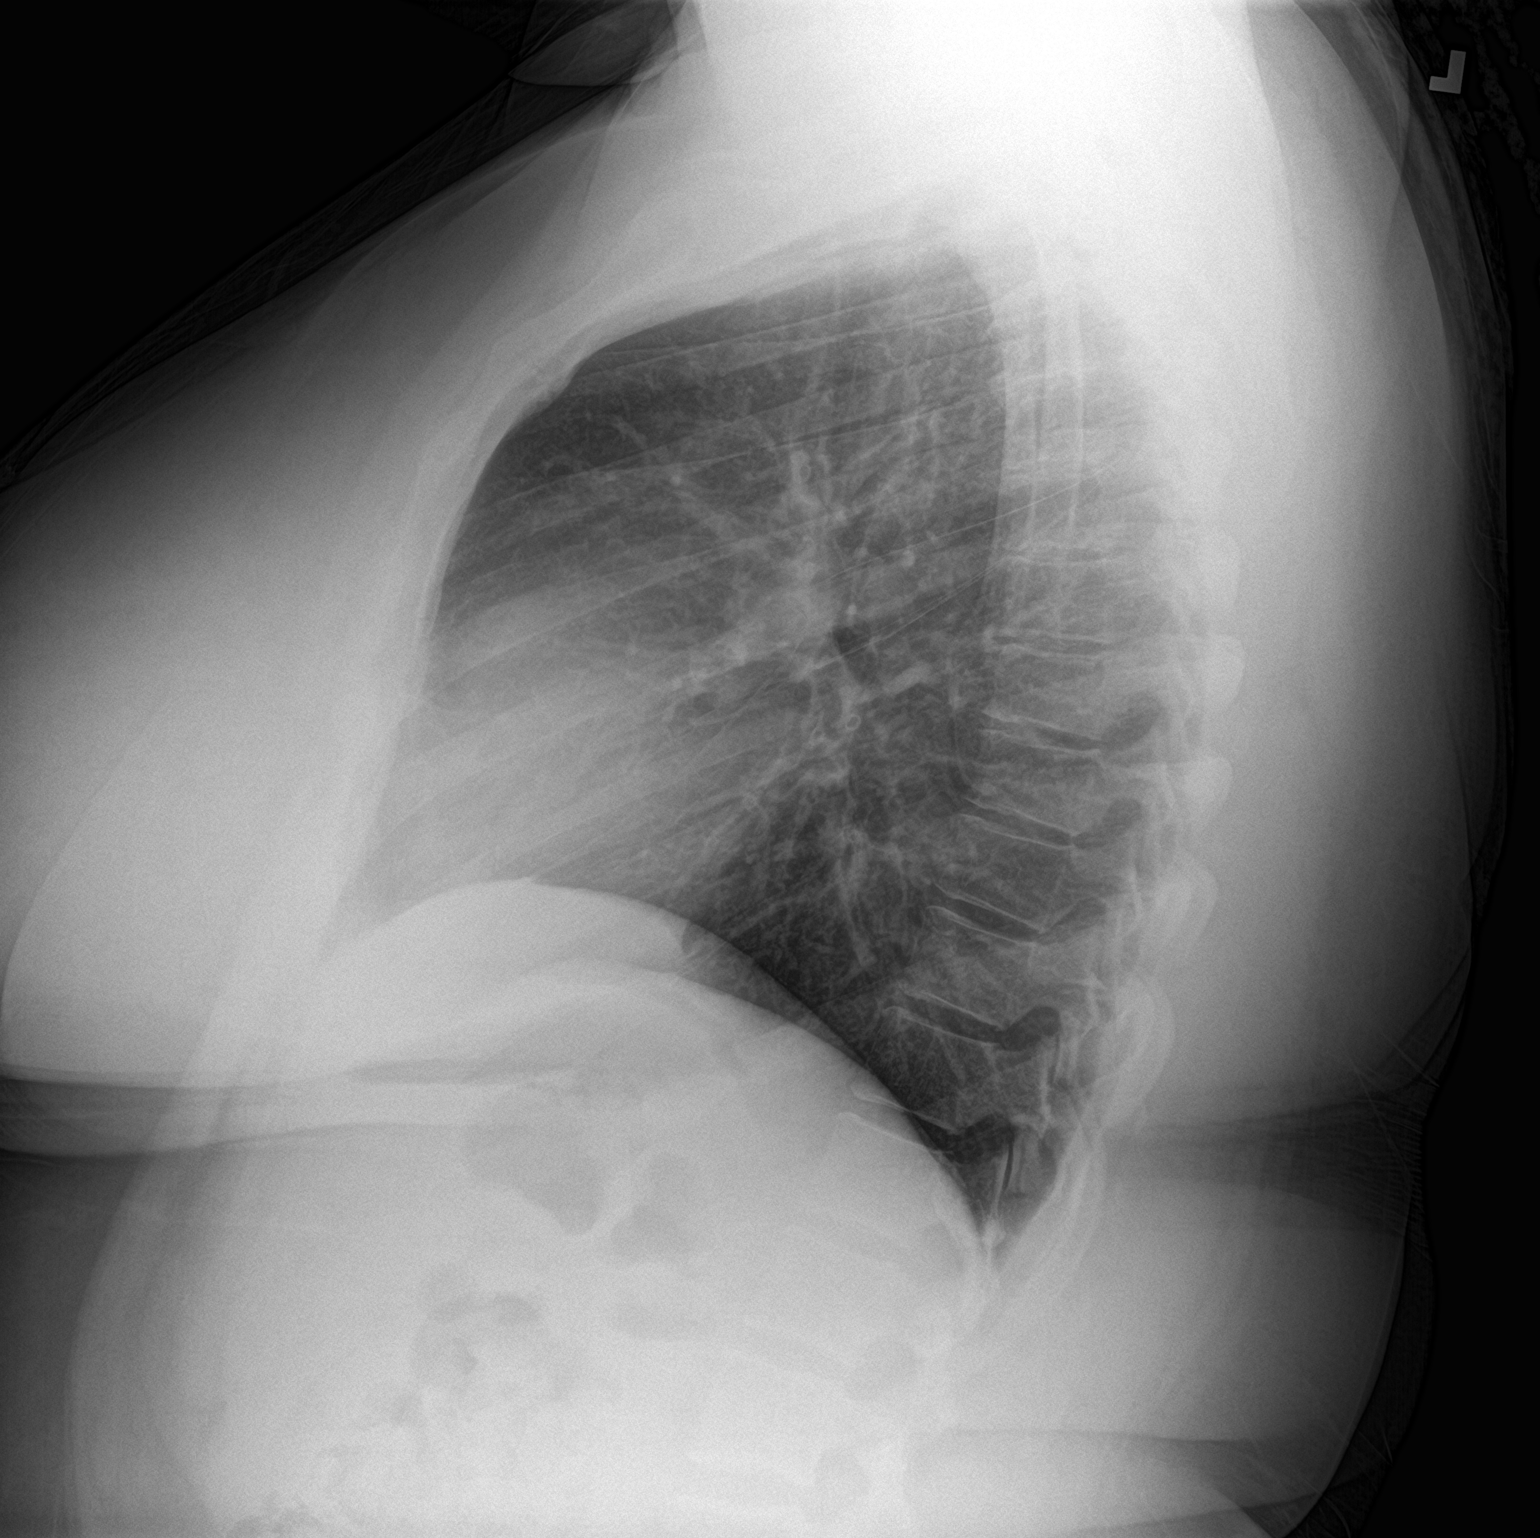

[2 of 2 positions shown; findings below may reference images not displayed]

FINDINGS: The heart size and mediastinal contours are within normal limits.
Both lungs are clear. The visualized skeletal structures are
unremarkable.
IMPRESSION: No active cardiopulmonary disease.
